# Patient Record
Sex: Female | Born: 1950 | Race: Black or African American | Hispanic: No | Marital: Married | Smoking: Former smoker
Health system: Southern US, Community
[De-identification: ages and names within clinical notes are randomized; demographics above are authoritative.]

## PROBLEM LIST (undated history)

## (undated) DIAGNOSIS — T8859XA Other complications of anesthesia, initial encounter: Secondary | ICD-10-CM

## (undated) DIAGNOSIS — I10 Essential (primary) hypertension: Secondary | ICD-10-CM

## (undated) DIAGNOSIS — R011 Cardiac murmur, unspecified: Secondary | ICD-10-CM

## (undated) DIAGNOSIS — J189 Pneumonia, unspecified organism: Secondary | ICD-10-CM

## (undated) DIAGNOSIS — Z9889 Other specified postprocedural states: Secondary | ICD-10-CM

## (undated) DIAGNOSIS — T4145XA Adverse effect of unspecified anesthetic, initial encounter: Secondary | ICD-10-CM

## (undated) DIAGNOSIS — M1990 Unspecified osteoarthritis, unspecified site: Secondary | ICD-10-CM

## (undated) DIAGNOSIS — R112 Nausea with vomiting, unspecified: Secondary | ICD-10-CM

## (undated) DIAGNOSIS — D649 Anemia, unspecified: Secondary | ICD-10-CM

## (undated) HISTORY — PX: JOINT REPLACEMENT: SHX530

## (undated) HISTORY — PX: PARTIAL HYSTERECTOMY: SHX80

## (undated) HISTORY — PX: TONSILLECTOMY: SUR1361

---

## 1999-10-05 ENCOUNTER — Emergency Department (HOSPITAL_COMMUNITY): Admission: EM | Admit: 1999-10-05 | Discharge: 1999-10-05 | Payer: Self-pay | Admitting: Emergency Medicine

## 1999-10-30 ENCOUNTER — Emergency Department (HOSPITAL_COMMUNITY): Admission: EM | Admit: 1999-10-30 | Discharge: 1999-10-30 | Payer: Self-pay | Admitting: Emergency Medicine

## 2000-03-25 ENCOUNTER — Emergency Department (HOSPITAL_COMMUNITY): Admission: EM | Admit: 2000-03-25 | Discharge: 2000-03-25 | Payer: Self-pay

## 2000-04-03 ENCOUNTER — Emergency Department (HOSPITAL_COMMUNITY): Admission: EM | Admit: 2000-04-03 | Discharge: 2000-04-03 | Payer: Self-pay | Admitting: Emergency Medicine

## 2000-04-06 ENCOUNTER — Emergency Department (HOSPITAL_COMMUNITY): Admission: EM | Admit: 2000-04-06 | Discharge: 2000-04-06 | Payer: Self-pay | Admitting: Emergency Medicine

## 2000-04-06 ENCOUNTER — Encounter: Payer: Self-pay | Admitting: Emergency Medicine

## 2000-04-06 ENCOUNTER — Ambulatory Visit (HOSPITAL_COMMUNITY): Admission: RE | Admit: 2000-04-06 | Discharge: 2000-04-06 | Payer: Self-pay | Admitting: Family Medicine

## 2004-12-10 ENCOUNTER — Emergency Department (HOSPITAL_COMMUNITY): Admission: EM | Admit: 2004-12-10 | Discharge: 2004-12-10 | Payer: Self-pay | Admitting: Emergency Medicine

## 2005-08-01 ENCOUNTER — Emergency Department (HOSPITAL_COMMUNITY): Admission: EM | Admit: 2005-08-01 | Discharge: 2005-08-01 | Payer: Self-pay | Admitting: Emergency Medicine

## 2005-08-26 ENCOUNTER — Encounter: Admission: RE | Admit: 2005-08-26 | Discharge: 2005-08-26 | Payer: Self-pay | Admitting: Family Medicine

## 2005-10-10 ENCOUNTER — Encounter: Admission: RE | Admit: 2005-10-10 | Discharge: 2005-10-10 | Payer: Self-pay | Admitting: Neurology

## 2005-12-26 ENCOUNTER — Ambulatory Visit: Payer: Self-pay | Admitting: Gastroenterology

## 2006-01-09 ENCOUNTER — Ambulatory Visit: Payer: Self-pay | Admitting: Gastroenterology

## 2006-09-28 ENCOUNTER — Encounter: Admission: RE | Admit: 2006-09-28 | Discharge: 2006-09-28 | Payer: Self-pay | Admitting: Family Medicine

## 2009-03-28 ENCOUNTER — Encounter: Admission: RE | Admit: 2009-03-28 | Discharge: 2009-03-28 | Payer: Self-pay | Admitting: Family Medicine

## 2010-09-14 ENCOUNTER — Encounter: Payer: Self-pay | Admitting: Family Medicine

## 2010-09-15 ENCOUNTER — Encounter: Payer: Self-pay | Admitting: Neurology

## 2010-09-15 ENCOUNTER — Encounter: Payer: Self-pay | Admitting: Family Medicine

## 2011-01-10 NOTE — Consult Note (Signed)
[facility]  Patient:    [name], [name]                     MRN: [id number] Proc. Date: 04/06/00 Adm. Date:  [id number] Disc. Date: [id number] Attending:  Shelba Flake CC:         Kendrick Ranch, M.D.  [facility], [address] [place] [place], [place]   Consultation Report  HISTORY OF PRESENT ILLNESS:  Peggy Harrell is a 60 year old right-handed black female, born Dec 19, 1950, with a history of onset of tinnitus involving mainly the left ear that started about two and a half weeks ago. Patient was found to have a wax plug in the left ear and this was irrigated and removed; patient, however, continued to have tinnitus that has actually worsened over time, typically severe yesterday and today.  Patient has what she believes is tinnitus only in the left ear, with decreased hearing in the left ear as well.  Patient has had some pressure sensation that also had developed across the head and patient denies any neck stiffness, however. Patient has some slight sensation of dizziness but not true vertigo, may have a slight change in her balance but not severe.  Today, patient had a brief episode of left face and left upper extremity paresthesias that have now completely cleared.  Patient has not had any slurring of speech, visual changes, double vision, loss of vision.  Does complain of a dry mouth. Patient has been seen by Dr. Kathy Breach of ear, nose and throat, and apparently has had audiometric testing, the results of which are not clear. Patients primary doctor is Dr. Kendrick Ranch.  Patient comes to the emergency room today and has had a CT scan of the brain that appears to show now acute changes.  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  Patient has had blood work that shows a fairly unremarkable comprehensive metabolic profile with the exception of a sodium of 3.1.  Neurology is asked to see  this patient for further evaluation.  PAST MEDICAL HISTORY 1. History of tinnitus and pressure sensation of the head, as above. 2. Obesity. 3. History of knee arthritis. 4. Borderline hypertension. 5. History of partial hysterectomy in 1994.  MEDICATIONS:  Patient is on some hydrochlorothiazide one a day.  She had been taking medications for arthritis.  ALLERGIES:  Patient has no known allergies.  SOCIAL HISTORY:  Does not smoke or drink.  Patient lives in the [place] area, is married and has two daughters who are alive and well.  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  FAMILY MEDICAL HISTORY:  Notable that the father died with a stroke and mother died with MI and stroke.  Patient has no brothers or sisters.  No family history of diabetes noted.  REVIEW OF SYSTEMS:  Notable for no fevers or chills.  Patient denies any loss of consciousness.  Has had some blurred vision at times.  Feels she has some difficulty in swallowing at times.  A dry mouth.  Denies neck stiffness, as above, and has had some slight shortness of breath at times.  Denies chest pain.  She has had some nausea today.  Denies any problems controlling the bowels or bladder.  PHYSICAL EXAMINATION  VITALS:  Blood pressure is 164/86, heart rate 85, respiratory rate 20, temperature afebrile.  GENERAL:  This patient is a markedly obese black female who is alert and cooperative at the time  of examination.  HEENT:  Head is atraumatic.  Eyes:  Pupils are equal, round and reactive to light.  Disks are flat bilaterally; no venous pulsations are seen, however. Tongue is midline.  Positive gag reflex is seen.  NECK:  Supple.  No carotid bruits.  RESPIRATORY:  Examination is clear to auscultation and percussion.  CARDIOVASCULAR:  Distant heart sounds.  No obvious murmurs or rubs noted.  EXTREMITIES:  Without significant edema.  NEUROLOGIC:  Cranial nerves as above.  Facial symmetry appears to be  present; patient occasionally with blink less rapidly on the right eye than the left, however.  Pinprick and soft touch sensation of the face are symmetric and normal.  Strength of facial muscles to direct testing is normal.  Speech is well-enunciated, not aphasic.  No dysarthria is seen.  Extraocular movements again are full.  No nystagmus is seen.  Tympanic membranes appear to be clear on the right, somewhat erythematous on the left.  Patient has good strength on all fours.  Good and symmetric motor tone is noted bilaterally.  Sensory testing reveals a very questionable slight decrease in pinprick sensation in the left arm and left leg as compared to the right.  Vibratory sensation again is slightly depressed on the left arm as compared to the right and left leg as compared to the right and deep tendon reflexes are symmetric and normal throughout.  Toes are downgoing bilaterally.  Patient has good finger-to-nose-to-finger and heel-to-shin bilaterally.  Rapid alternating movements in the upper extremities are symmetric and normal.  Gait is normal. Good tandem gait is seen.  Romberg negative.  No evidence of pronator drift is seen.  IMAGING STUDY:  CT scan of the head again shows no significant abnormalities of the cavum septum pellucidum are noted.  LABORATORY VALUES:  Laboratory values show sodium of 138, potassium of 3.2, chloride of 99, CO2 of 31, glucose of 75, BUN of 11, creatinine 0.8, calcium 9.0, total protein 6.6, albumin of 3.4, AST of 17, ALT of 14, alkaline phosphatase 88, total bilirubin of 0.7.  IMPRESSION  New onset of pressure sensation of the head and tinnitus, left ear, etiology unclear.  This patient has had a gradual onset of the tinnitus but over the last two days, the patient has experienced a pressure sensation in the head and had a brief episode of left face and arm numbness that has cleared.  Examination at this point is completely unremarkable with the  exception of some residual  subjective sensory changes.  Patient will require further evaluation to exclude small vessel ischemic disease.  Will pursue this workup as an outpatient.  PLAN 1. MRI scan of the brain. 2. MRI angiogram of intracranial and carotid vessels. 3. Baby aspirin 81 mg one a day. 4. If above studies are unremarkable, may consider brain stem auditory-evoked    response testing and possibly a lumbar puncture to rule out pseudotumor    cerebri as a possible etiology of the above problems. 5. Patient will follow up at [facility] in two to three    weeks following discharge. DD:  04/06/00 TD:  04/07/00 Job: [id number]

## 2011-09-08 ENCOUNTER — Emergency Department (HOSPITAL_COMMUNITY)
Admission: EM | Admit: 2011-09-08 | Discharge: 2011-09-08 | Disposition: A | Discharge status: Home / Self Care | Payer: Self-pay | Attending: Emergency Medicine | Admitting: Emergency Medicine

## 2011-09-08 ENCOUNTER — Emergency Department (HOSPITAL_COMMUNITY): Payer: Self-pay

## 2011-09-08 ENCOUNTER — Encounter (HOSPITAL_COMMUNITY): Payer: Self-pay | Admitting: *Deleted

## 2011-09-08 DIAGNOSIS — E669 Obesity, unspecified: Secondary | ICD-10-CM | POA: Insufficient documentation

## 2011-09-08 DIAGNOSIS — R42 Dizziness and giddiness: Secondary | ICD-10-CM | POA: Insufficient documentation

## 2011-09-08 DIAGNOSIS — R112 Nausea with vomiting, unspecified: Secondary | ICD-10-CM | POA: Insufficient documentation

## 2011-09-08 DIAGNOSIS — R51 Headache: Secondary | ICD-10-CM | POA: Insufficient documentation

## 2011-09-08 HISTORY — DX: Essential (primary) hypertension: I10

## 2011-09-08 MED ORDER — ONDANSETRON HCL 4 MG/2ML IJ SOLN
4.0000 mg | Freq: Once | INTRAMUSCULAR | Status: AC
  Administered 2011-09-08: 4 mg via INTRAVENOUS
  Filled 2011-09-08: qty 2

## 2011-09-08 MED ORDER — ONDANSETRON HCL 4 MG PO TABS: 4.0000 mg | ORAL_TABLET | Freq: Four times a day (QID) | ORAL | Status: AC

## 2011-09-08 MED ORDER — IOHEXOL 350 MG/ML SOLN
100.0000 mL | Freq: Once | INTRAVENOUS | Status: AC | PRN
  Administered 2011-09-08: 100 mL via INTRAVENOUS

## 2011-09-08 MED ORDER — SODIUM CHLORIDE 0.9 % IV BOLUS (SEPSIS)
1000.0000 mL | Freq: Once | INTRAVENOUS | Status: AC
  Administered 2011-09-08: 1000 mL via INTRAVENOUS

## 2011-09-08 MED ORDER — DIAZEPAM 5 MG/ML IJ SOLN
5.0000 mg | Freq: Once | INTRAMUSCULAR | Status: AC
  Administered 2011-09-08: 5 mg via INTRAVENOUS
  Filled 2011-09-08: qty 2

## 2011-09-08 NOTE — ED Notes (Signed)
WUJ:[id number]<JY> Expected date:<BR> Expected time:<BR> Means of arrival:<BR> Comments:<BR> Hold for triage 1

## 2011-09-08 NOTE — ED Notes (Signed)
Pt in c/o sudden onset of a headache, states she was in bed and turned her head and felt like something popped in the back of her neck, instantly developed a headache and vomited. Pt c/o dizziness since that time. Denies a history of headaches.

## 2011-09-08 NOTE — ED Notes (Signed)
Pt reports she twisted her neck and heard a pop earlier today and since then has had severe dizziness, headache, N/V.

## 2011-09-16 NOTE — ED Provider Notes (Signed)
History    [age]-year-old female with headache. Sudden onset while she was turning her head while laying in bed. Persistent dense. Headache is in the posterior region. No radiation. Associated with dizziness. Does not describe true vertigo. Describes more of a sensation of lightheadedness that she may pass out. Feels very nauseated. Vomiting times one. No history of similar headaches. No fever or chills. Felt fine earlier in the day. No numbness, weakness, loss of strength, or acute visual changes. Neck does not feel stiff.  CSN: [id number]  Arrival date & time 09/08/11  1814   First MD Initiated Contact with Patient 09/08/11 1841      Chief Complaint  Patient presents with  . Headache  . Emesis    (Consider location/radiation/quality/duration/timing/severity/associated sxs/prior treatment) HPI  Past Medical History  Diagnosis Date  . Hypertension     History reviewed. No pertinent past surgical history.  History reviewed. No pertinent family history.  History  Substance Use Topics  . Smoking status: Never Smoker   . Smokeless tobacco: Not on file  . Alcohol Use: No    OB History    Grav Para Term Preterm Abortions TAB SAB Ect Mult Living                  Review of Systems   Review of symptoms negative unless otherwise noted in HPI.   Allergies  Sulfa antibiotics  Home Medications   Current Outpatient Rx  Name Route Sig Dispense Refill  . VITAMIN D 1000 UNITS PO TABS Oral Take 1,000 Units by mouth daily.    Marland Kitchen HYDROCHLOROTHIAZIDE 25 MG PO TABS Oral Take 25 mg by mouth daily.    Marland Kitchen VITAMIN B-12 1000 MCG PO TABS Oral Take 1,000 mcg by mouth daily.    Marland Kitchen ONDANSETRON HCL 4 MG PO TABS Oral Take 1 tablet (4 mg total) by mouth every 6 (six) hours. 12 tablet 0    BP 135/65  Pulse 74  Temp(Src) 98.7 F (37.1 C) (Oral)  Resp 16  SpO2 100%  Physical Exam  Nursing note and vitals reviewed. Constitutional: She is oriented to person, place, and time. No distress.     obese  HENT:  Head: Normocephalic and atraumatic.  Right Ear: External ear normal.  Left Ear: External ear normal.  Eyes: Conjunctivae are normal. Pupils are equal, round, and reactive to light. Right eye exhibits no discharge. Left eye exhibits no discharge.  Neck: Normal range of motion. Neck supple.  Cardiovascular: Normal rate, regular rhythm and normal heart sounds.  Exam reveals no gallop and no friction rub.   No murmur heard. Pulmonary/Chest: Effort normal and breath sounds normal. No respiratory distress.  Abdominal: Soft. She exhibits no distension. There is no tenderness.  Musculoskeletal: She exhibits no edema and no tenderness.  Lymphadenopathy:    She has no cervical adenopathy.  Neurological: She is alert and oriented to person, place, and time. No cranial nerve deficit. She exhibits normal muscle tone. Coordination normal.       Good finger to nose and heel to shin testing bilaterally.  Skin: Skin is warm and dry. She is not diaphoretic.  Psychiatric: She has a normal mood and affect. Her behavior is normal. Thought content normal.    ED Course  Procedures (including critical care time)  Labs Reviewed - No data to display No results found.  Ct Angio Head W/cm &/or Wo Cm  09/08/2011  *RADIOLOGY REPORT*  Clinical Data:  Turned  neck and felt pop in  the occipital region. Severe vertigo and headaches since.  Possible vertebral artery dissection nor subarachnoid hemorrhage.  CT ANGIOGRAPHY HEAD AND NECK  Technique:  Multidetector CT imaging of the head and neck was performed using the standard protocol during bolus administration of intravenous contrast.  Multiplanar CT image reconstructions including MIPs were obtained to evaluate the vascular anatomy. Carotid stenosis measurements (when applicable) are obtained utilizing NASCET criteria, using the distal internal carotid diameter as the denominator.  Contrast: OMNIPAQUE IOHEXOL 350 MG/ML IV SOLN  Comparison:  MRI  08/26/2005.  Head CT 08/01/2005  CTA NECK  Findings:  Lung apices are clear.  No superior mediastinal pathology.  Branching pattern of the brachiocephalic vessels from the arch is normal.  No origin stenoses.  The right common carotid artery is widely patent to the bifurcation.  No bifurcation disease.  External and internal cervical carotid arteries appear normal.  The left common carotid arteries widely patent to the bifurcation. No bifurcation disease.  Internal and external carotid arteries are normal through the cervical region.  Both vertebral artery origins are widely patent.  The left vertebral artery is slightly larger than the right.  Both vertebral arteries appear normal through the neck and through the foramen magnum.  No soft tissue lesion of the neck is identified.  No significant bony finding.   Review of the MIP images confirms the above findings.  IMPRESSION: Negative CT angiography of the neck vessels.  Specifically, no evidence of vertebral artery dissection or other pathology.  CTA HEAD  Findings:  Both internal carotid arteries are widely patent through the siphon regions.  Both anterior and middle cerebral vessels are patent without stenosis, aneurysm or vascular malformation.  Both vertebral arteries are widely patent to the basilar.  No basilar stenosis.  Posterior circulation branch vessels are patent.  There is a large tentorial vein on the right, an anatomic variant but not of clinical significance.  The brain has a normal appearance without evidence of old or acute infarction, mass lesion, hemorrhage, hydrocephalus or extra-axial collection.  Incidental cavum septum pellucidum.  No calvarial abnormality.  Sinuses, middle ears and mastoids are clear.   Review of the MIP images confirms the above findings.  IMPRESSION: Normal intracranial CT angiography.  No evidence of vascular dissection.  No evidence of intracranial hemorrhage.  Normal appearing brain.  Original Report Authenticated By:  Thomasenia Sales, M.D.   Ct Angio Neck W/cm &/or Wo/cm  09/08/2011  *RADIOLOGY REPORT*  Clinical Data:  Turned  neck and felt pop in the occipital region. Severe vertigo and headaches since.  Possible vertebral artery dissection nor subarachnoid hemorrhage.  CT ANGIOGRAPHY HEAD AND NECK  Technique:  Multidetector CT imaging of the head and neck was performed using the standard protocol during bolus administration of intravenous contrast.  Multiplanar CT image reconstructions including MIPs were obtained to evaluate the vascular anatomy. Carotid stenosis measurements (when applicable) are obtained utilizing NASCET criteria, using the distal internal carotid diameter as the denominator.  Contrast: OMNIPAQUE IOHEXOL 350 MG/ML IV SOLN  Comparison:  MRI 08/26/2005.  Head CT 08/01/2005  CTA NECK  Findings:  Lung apices are clear.  No superior mediastinal pathology.  Branching pattern of the brachiocephalic vessels from the arch is normal.  No origin stenoses.  The right common carotid artery is widely patent to the bifurcation.  No bifurcation disease.  External and internal cervical carotid arteries appear normal.  The left common carotid arteries widely patent to the bifurcation. No bifurcation disease.  Internal and external carotid arteries are normal through the cervical region.  Both vertebral artery origins are widely patent.  The left vertebral artery is slightly larger than the right.  Both vertebral arteries appear normal through the neck and through the foramen magnum.  No soft tissue lesion of the neck is identified.  No significant bony finding.   Review of the MIP images confirms the above findings.  IMPRESSION: Negative CT angiography of the neck vessels.  Specifically, no evidence of vertebral artery dissection or other pathology.  CTA HEAD  Findings:  Both internal carotid arteries are widely patent through the siphon regions.  Both anterior and middle cerebral vessels are patent without stenosis,  aneurysm or vascular malformation.  Both vertebral arteries are widely patent to the basilar.  No basilar stenosis.  Posterior circulation branch vessels are patent.  There is a large tentorial vein on the right, an anatomic variant but not of clinical significance.  The brain has a normal appearance without evidence of old or acute infarction, mass lesion, hemorrhage, hydrocephalus or extra-axial collection.  Incidental cavum septum pellucidum.  No calvarial abnormality.  Sinuses, middle ears and mastoids are clear.   Review of the MIP images confirms the above findings.  IMPRESSION: Normal intracranial CT angiography.  No evidence of vascular dissection.  No evidence of intracranial hemorrhage.  Normal appearing brain.  Original Report Authenticated By: Thomasenia Sales, M.D.   1. Headache   2. Dizziness and giddiness       MDM  [age]-year-old female with headache. Sudden onset while turning her head in bed. Concern for possible vertebral or carotid artery dissection as etiology. Subsequent CTA of the head/neck did not show any evidence of this there is no evidence of any intracranial bleeding. Prior to discharge patient reports feeling much better. Her  dizziness has completely resolved. She has a nonfocal neurological examination. Strict return precautions discussed. Followup as an outpatient.        Raeford Razor, MD 09/16/11 [id number]

## 2012-05-05 ENCOUNTER — Other Ambulatory Visit: Payer: Self-pay | Admitting: Family Medicine

## 2012-05-05 DIAGNOSIS — Z780 Asymptomatic menopausal state: Secondary | ICD-10-CM

## 2012-05-26 ENCOUNTER — Inpatient Hospital Stay: Admission: RE | Admit: 2012-05-26 | Payer: Self-pay | Source: Ambulatory Visit

## 2012-05-26 ENCOUNTER — Ambulatory Visit: Payer: Self-pay

## 2012-06-23 ENCOUNTER — Other Ambulatory Visit: Payer: Self-pay

## 2012-06-23 ENCOUNTER — Ambulatory Visit: Payer: Self-pay

## 2012-08-04 ENCOUNTER — Other Ambulatory Visit: Payer: Self-pay

## 2012-08-04 ENCOUNTER — Ambulatory Visit: Payer: Self-pay

## 2012-09-15 ENCOUNTER — Ambulatory Visit
Admission: RE | Admit: 2012-09-15 | Discharge: 2012-09-15 | Disposition: A | Discharge status: Home / Self Care | Payer: 59 | Source: Ambulatory Visit | Attending: Family Medicine | Admitting: Family Medicine

## 2012-09-15 DIAGNOSIS — Z780 Asymptomatic menopausal state: Secondary | ICD-10-CM

## 2012-09-20 ENCOUNTER — Other Ambulatory Visit: Payer: Self-pay | Admitting: Family Medicine

## 2012-09-20 DIAGNOSIS — R928 Other abnormal and inconclusive findings on diagnostic imaging of breast: Secondary | ICD-10-CM

## 2012-09-28 ENCOUNTER — Other Ambulatory Visit: Payer: Self-pay | Admitting: Family Medicine

## 2012-09-28 DIAGNOSIS — R928 Other abnormal and inconclusive findings on diagnostic imaging of breast: Secondary | ICD-10-CM

## 2012-10-08 ENCOUNTER — Other Ambulatory Visit: Payer: 59

## 2012-10-13 ENCOUNTER — Ambulatory Visit
Admission: RE | Admit: 2012-10-13 | Discharge: 2012-10-13 | Disposition: A | Discharge status: Home / Self Care | Payer: 59 | Source: Ambulatory Visit | Attending: Family Medicine | Admitting: Family Medicine

## 2012-10-13 DIAGNOSIS — R928 Other abnormal and inconclusive findings on diagnostic imaging of breast: Secondary | ICD-10-CM

## 2013-10-22 ENCOUNTER — Encounter (HOSPITAL_COMMUNITY): Payer: Self-pay | Admitting: Emergency Medicine

## 2013-10-22 ENCOUNTER — Emergency Department (HOSPITAL_COMMUNITY)
Admission: EM | Admit: 2013-10-22 | Discharge: 2013-10-22 | Disposition: A | Discharge status: Home / Self Care | Payer: 59 | Attending: Emergency Medicine | Admitting: Emergency Medicine

## 2013-10-22 DIAGNOSIS — M79652 Pain in left thigh: Secondary | ICD-10-CM

## 2013-10-22 DIAGNOSIS — G5710 Meralgia paresthetica, unspecified lower limb: Secondary | ICD-10-CM | POA: Insufficient documentation

## 2013-10-22 DIAGNOSIS — R209 Unspecified disturbances of skin sensation: Secondary | ICD-10-CM | POA: Insufficient documentation

## 2013-10-22 DIAGNOSIS — M79609 Pain in unspecified limb: Secondary | ICD-10-CM

## 2013-10-22 DIAGNOSIS — Z79899 Other long term (current) drug therapy: Secondary | ICD-10-CM | POA: Insufficient documentation

## 2013-10-22 DIAGNOSIS — G5712 Meralgia paresthetica, left lower limb: Secondary | ICD-10-CM

## 2013-10-22 DIAGNOSIS — M545 Low back pain, unspecified: Secondary | ICD-10-CM | POA: Insufficient documentation

## 2013-10-22 DIAGNOSIS — I10 Essential (primary) hypertension: Secondary | ICD-10-CM | POA: Insufficient documentation

## 2013-10-22 DIAGNOSIS — E669 Obesity, unspecified: Secondary | ICD-10-CM | POA: Insufficient documentation

## 2013-10-22 MED ORDER — HYDROCODONE-ACETAMINOPHEN 5-325 MG PO TABS
1.0000 | ORAL_TABLET | Freq: Once | ORAL | Status: AC
  Administered 2013-10-22: 1 via ORAL
  Filled 2013-10-22: qty 1

## 2013-10-22 MED ORDER — HYDROCODONE-ACETAMINOPHEN 5-325 MG PO TABS: 1.0000 | ORAL_TABLET | Freq: Four times a day (QID) | ORAL | Status: DC | PRN

## 2013-10-22 NOTE — ED Notes (Signed)
MD at bedside. 

## 2013-10-22 NOTE — Progress Notes (Signed)
*  Preliminary Results* Left lower extremity venous duplex completed. Left lower extremity is negative for deep vein thrombosis. There is no evidence of left Baker's cyst.  10/22/2013 12:07 PM  Gertie FeyMichelle Levis Nazir, RVT, RDCS, RDMS

## 2013-10-22 NOTE — ED Provider Notes (Signed)
CSN: [id number]     Arrival date & time 10/22/13  4098 History   First MD Initiated Contact with Patient 10/22/13 1021     Chief Complaint  Patient presents with  . Leg Pain     (Consider location/radiation/quality/duration/timing/severity/associated sxs/prior Treatment) HPI  63 year old female with history of hypertension presents complaining of left thigh pain and leg weakness. Patient reports for the past 2-3 days she has had a throbbing pain to the top of her left thigh, persistent, worsening with certain position.  Pain occasionally radiates to her low back. Endorse mild low back pain, worsening with leg movement. She mentioned persistent tingling and numbness sensation to the surface of the left thigh, nothing makes it better or worse.  She also reports generalized lower leg weakness for the past several months. States her doctor is aware. She reports heart palpitation for about a minute 2 days ago has resolved. She also endorsed a mild nausea this morning has also resolved. She denies any prior history of DVT but does admits to long trip to [place] and back about a week ago. Not taking any birth control pill, no recent surgery, no unilateral leg swelling, no history of cancer, no history of IV drug use. Has been taking Aleve with minimal relief. Patient denies wearing tight pants, or wearing wallet on the affected side.  Past Medical History  Diagnosis Date  . Hypertension    History reviewed. No pertinent past surgical history. History reviewed. No pertinent family history. History  Substance Use Topics  . Smoking status: Never Smoker   . Smokeless tobacco: Not on file  . Alcohol Use: No   OB History   Grav Para Term Preterm Abortions TAB SAB Ect Mult Living                 Review of Systems  All other systems reviewed and are negative.      Allergies  Lisinopril and Sulfa antibiotics  Home Medications   Current Outpatient Rx  Name  Route  Sig  Dispense  Refill   . cholecalciferol (VITAMIN D) 1000 UNITS tablet   Oral   Take 1,000 Units by mouth daily.         . hydrochlorothiazide (HYDRODIURIL) 25 MG tablet   Oral   Take 25 mg by mouth daily.          BP 158/72  Pulse 90  Temp(Src) 97.9 F (36.6 C) (Oral)  Resp 16  Ht 5\' 10"  (1.778 m)  Wt 274 lb 8 oz (124.512 kg)  BMI 39.39 kg/m2  SpO2 97% Physical Exam  Nursing note and vitals reviewed. Constitutional: She appears well-developed and well-nourished. No distress.  Patient is moderately obese  HENT:  Head: Atraumatic.  Eyes: Conjunctivae are normal.  Neck: Neck supple.  Cardiovascular: Intact distal pulses.   Musculoskeletal: She exhibits tenderness (Left thigh with generalized tenderness on palpation without focal point tenderness. Mild decreased sensation to the anterior aspects of left thigh with light touch. No swelling noted.). She exhibits no edema.  No significant midline spine tenderness, crepitus, step off noted, no overlying skin changes.  Negative straight leg raise. Bilateral hip with full range of motion.  Unable to elicit patellar deep tendon reflex bilaterally but no foot drop. 5/5 strength to all 4 extremities with intact distal pulses.  Normal skin color to BLE.      Neurological: She is alert.  Skin: No rash noted.  Psychiatric: She has a normal mood and affect.  ED Course  Procedures (including critical care time)  11:03 AM Patient complaining of subjective paresthesia to anterior thigh on the left leg. Symptoms suggestive of meralgia paresthesia especially considering that patient is obese. However since patient had recent long trip in complaining of thigh pain will obtain vascular Doppler to rule out DVT. No signs to suggest infection. She is otherwise neurovascularly intact and no focal weakness. Pain medication given.  12:06 PM The venous Doppler ultrasound of left lower extremities without evidence of DVT. Since patient is neurovascularly intact,  normal strength, no evidence of infection, and no red flags recommend patient to followup with PCP for further management.  Care discussed with attending.     Duard BradyHopkins, [place] J Female 1951/05/15 [id number]            Progress Notes by Lawrence MarseillesMichelle A Simonetti at 10/22/2013 12:07 PM    Author: Lawrence MarseillesMichelle A Simonetti Service: Vascular Lab Author Type: Cardiovascular Sonographer   Filed: 10/22/2013 12:07 PM Note Time: 10/22/2013 12:07 PM Status: Signed   Editor: Lawrence MarseillesMichelle A Simonetti (Cardiovascular Sonographer)      *Preliminary Results*  Left lower extremity venous duplex completed.  Left lower extremity is negative for deep vein thrombosis. There is no evidence of left Baker's cyst.  10/22/2013 12:07 PM  Gertie FeyMichelle Simonetti, RVT, RDCS, RDMS         Labs Review Labs Reviewed - No data to display Imaging Review No results found.   EKG Interpretation None      MDM   Final diagnoses:  Musculoskeletal pain of left thigh  Meralgia paresthetica of left side    BP 150/84  Pulse 65  Temp(Src) 97.9 F (36.6 C) (Oral)  Resp 16  Ht 5\' 10"  (1.778 m)  Wt 274 lb 8 oz (124.512 kg)  BMI 39.39 kg/m2  SpO2 100%  I have reviewed nursing notes and vital signs. I personally reviewed the imaging tests through PACS system  I reviewed available ER/hospitalization records thought the EMR     Fayrene HelperBowie Amiere Cawley, [place] JerseyPA-C 10/22/13 1221

## 2013-10-22 NOTE — Discharge Instructions (Signed)
Please follow up with your doctor for further care.  Take pain medication as needed.  Return if you developed skin changes, signs of infection, or if you have other concerns.   Pinched Nerve The term pinched nerve describes one type of damage or injury to a nerve or set of nerves. Pinched nerves can sometimes lead to other conditions. These include peripheral neuropathy, carpal tunnel syndrome, and tennis elbow. The extent of such injuries may vary from minor, temporary damage to a more permanent condition. Early diagnosis is important to prevent further damage or complications. Pinched nerve is a common cause of on-the-job injury. CAUSES  The injury may result from:  Compression.  Constriction.  Stretching. SYMPTOMS  Symptoms include:  Numbness.  "Pins and needles" or burning sensations.  Pain radiating outward from the injured area.  One of the most common examples of a single compressed nerve is the feeling of having a foot or hand "fall asleep." TREATMENT  The most often recommended treatment for pinched nerve is rest for the affected area. Corticosteroids help alleviate pain. In some cases, surgery is recommended. Physical therapy may be recommended. Splints or collars may be used. With treatment, most people recover from pinched nerve. In some cases, the damage is irreversible. Document Released: 08/01/2002 Document Revised: 11/03/2011 Document Reviewed: 07/19/2008 [facility] Patient Information 2014 [place]ExitCare, [place].

## 2013-10-22 NOTE — ED Notes (Signed)
US Technician stated, "Patient is negative for DVT."

## 2013-10-22 NOTE — ED Notes (Signed)
C/o intermittent numbness and pain to L thigh for past weeks, yesterday it was more severe and her lower back started to hurt. Today she woke with nausea. She took aleve yesterday with some relief of pain

## 2013-10-22 NOTE — ED Provider Notes (Signed)
Medical screening examination/treatment/procedure(s) were performed by non-physician practitioner and as supervising physician I was immediately available for consultation/collaboration.    Ebonye Reade L Artie Takayama, MD 10/22/13 1238 

## 2013-10-22 NOTE — ED Notes (Signed)
US Technician at the bedside.

## 2013-11-24 ENCOUNTER — Other Ambulatory Visit: Payer: Self-pay

## 2013-11-24 DIAGNOSIS — Z1231 Encounter for screening mammogram for malignant neoplasm of breast: Secondary | ICD-10-CM

## 2013-12-14 ENCOUNTER — Ambulatory Visit: Payer: 59

## 2013-12-14 ENCOUNTER — Ambulatory Visit
Admission: RE | Admit: 2013-12-14 | Discharge: 2013-12-14 | Disposition: A | Discharge status: Home / Self Care | Payer: 59 | Source: Ambulatory Visit

## 2013-12-14 ENCOUNTER — Encounter (INDEPENDENT_AMBULATORY_CARE_PROVIDER_SITE_OTHER): Payer: Self-pay

## 2013-12-14 DIAGNOSIS — Z1231 Encounter for screening mammogram for malignant neoplasm of breast: Secondary | ICD-10-CM

## 2014-09-22 ENCOUNTER — Other Ambulatory Visit: Payer: Self-pay | Admitting: Family Medicine

## 2014-09-22 DIAGNOSIS — Z1231 Encounter for screening mammogram for malignant neoplasm of breast: Secondary | ICD-10-CM

## 2014-12-18 ENCOUNTER — Ambulatory Visit: Payer: Self-pay

## 2015-01-25 ENCOUNTER — Ambulatory Visit
Admission: RE | Admit: 2015-01-25 | Discharge: 2015-01-25 | Disposition: A | Discharge status: Home / Self Care | Payer: 59 | Source: Ambulatory Visit | Attending: Family Medicine | Admitting: Family Medicine

## 2015-01-25 ENCOUNTER — Ambulatory Visit: Payer: Self-pay

## 2015-01-25 DIAGNOSIS — Z1231 Encounter for screening mammogram for malignant neoplasm of breast: Secondary | ICD-10-CM

## 2015-01-28 ENCOUNTER — Encounter (HOSPITAL_COMMUNITY): Payer: Self-pay | Admitting: *Deleted

## 2015-01-28 ENCOUNTER — Emergency Department (HOSPITAL_COMMUNITY)
Admission: EM | Admit: 2015-01-28 | Discharge: 2015-01-28 | Disposition: A | Discharge status: Home / Self Care | Payer: 59 | Attending: Emergency Medicine | Admitting: Emergency Medicine

## 2015-01-28 DIAGNOSIS — Z791 Long term (current) use of non-steroidal anti-inflammatories (NSAID): Secondary | ICD-10-CM | POA: Insufficient documentation

## 2015-01-28 DIAGNOSIS — Z79899 Other long term (current) drug therapy: Secondary | ICD-10-CM | POA: Insufficient documentation

## 2015-01-28 DIAGNOSIS — H43392 Other vitreous opacities, left eye: Secondary | ICD-10-CM

## 2015-01-28 DIAGNOSIS — I10 Essential (primary) hypertension: Secondary | ICD-10-CM | POA: Insufficient documentation

## 2015-01-28 NOTE — ED Notes (Signed)
Awake. Verbally responsive. A/O x4. Resp even and unlabored. No audible adventitious breath sounds noted. ABC's intact.  

## 2015-01-28 NOTE — ED Provider Notes (Signed)
CSN: [id number]     Arrival date & time 01/28/15  [id number] History   First MD Initiated Contact with Patient 01/28/15 [phone]     Chief Complaint  Patient presents with  . Eye Problem    HPI Patient presents to the emergency room with complaints of intermittent visual field disturbances of her left eye. Patient states over the last week she has noticed an intermittent object floating across her field of vision in her left eye. Sometimes it looks like something is dripping down. Sometimes seems like there is stranding. It has been coming and going for the past week. She talked to the nurse hotline for her insurance and was told to come to the emergency room. Patient wanted to wait overnight to see if the symptoms would resolve but she still notices it somewhat today. She denies any trouble with blurred vision. She does not have any pain. She denies any injuries. She has not noticed any visual field cuts. Past Medical History  Diagnosis Date  . Hypertension    History reviewed. No pertinent past surgical history. No family history on file. History  Substance Use Topics  . Smoking status: Never Smoker   . Smokeless tobacco: Not on file  . Alcohol Use: No   OB History    No data available     Review of Systems  All other systems reviewed and are negative.     Allergies  Lisinopril and Sulfa antibiotics  Home Medications   Prior to Admission medications   Medication Sig Start Date End Date Taking? Authorizing Provider  cholecalciferol (VITAMIN D) 1000 UNITS tablet Take 3,000 Units by mouth daily.     Historical Provider, MD  hydrochlorothiazide (HYDRODIURIL) 25 MG tablet Take 25 mg by mouth daily.    Historical Provider, MD  HYDROcodone-acetaminophen (NORCO/VICODIN) 5-325 MG per tablet Take 1 tablet by mouth every 6 (six) hours as needed for severe pain. 10/22/13   Fayrene HelperBowie Tran, PA-C  Multiple Vitamins-Minerals (HAIR/SKIN/NAILS PO) Take 3 capsules by mouth daily.    Historical Provider, MD   naproxen sodium (ANAPROX) 220 MG tablet Take 220 mg by mouth daily as needed (arthritis/pain).    Historical Provider, MD   BP 140/69 mmHg  Pulse 91  Temp(Src) 98.3 F (36.8 C) (Oral)  Resp 18  SpO2 97% Physical Exam  Constitutional: She appears well-developed and well-nourished. No distress.  HENT:  Head: Normocephalic and atraumatic.  Right Ear: External ear normal.  Left Ear: External ear normal.  Eyes: Conjunctivae and EOM are normal. Right eye exhibits no discharge. Left eye exhibits no discharge. No scleral icterus. Right eye exhibits normal extraocular motion. Left eye exhibits normal extraocular motion. Right pupil is round. Left pupil is round. Pupils are equal.  Fundoscopic exam:      The left eye shows no exudate, no hemorrhage and no papilledema.  Neck: Neck supple. No tracheal deviation present.  Cardiovascular: Normal rate.   Pulmonary/Chest: Effort normal. No stridor. No respiratory distress.  Musculoskeletal: She exhibits no edema.  Neurological: She is alert. Cranial nerve deficit: no gross deficits.  Patient has no deficits in her visual fields  Skin: Skin is warm and dry. No rash noted.  Psychiatric: She has a normal mood and affect.  Nursing note and vitals reviewed.   ED Course  Procedures (including critical care time) Labs Review Labs Reviewed - No data to display  Imaging Review No results found.   EKG Interpretation None      MDM   Final  diagnoses:  Floaters in visual field, left    The patient's pupils were not dilated for the exam. There was some limitations because of the pupil size. However, I do not see any evidence of hemorrhage or retinal detachment. Symptoms are more suggestive of vitreous floaters. I will have the patient follow up with an ophthalmologist. Warning signs and precautions were discussed    Linwood Dibbles, MD 01/28/15 1007

## 2015-01-28 NOTE — Discharge Instructions (Signed)
Eye Floaters A jelly-like fluid fills the inside of the eye and is called the vitreous. The vitreous is normally clear. It allows light to pass through to the back of the eye to the tissues that contain the nerves needed for vision (the retina). With age, the vitreous can start to decline. If a decline happens, specks of material from clumps of cells, blood, or other materials may start to float around inside the eye. These objects cast shadows on the retina. These shadows are seen as moving strings, streaks, "bugs," dust or spider webs floating in front of the eye. CAUSES   Age.  A high degree of near-sightedness (high myopia).  Tears in the retina.  Bleeding inside the eye from broken retinal blood vessels as a result of disease (diabetes, inflammation of the retinal blood vessels, and others).  Blood clot of the major vein of the retina or its branches (retinal vein occlusion).  Trauma.  Retinal detachment.  Vitreous detachment.  Eye surgery.  Inflammation inside the eye (uveitis).  Infection inside the eye. SYMPTOMS   Seeing floating specs, dots or spider webs in the vision of one eye. This can sometimes be associated with flashes of light seen off to the side.  Bleeding in the eye may begin as floaters and lead to complete vision loss as the vitreous fills with blood. This may happen repeatedly in certain diseases of the blood vessels of the retina (e.g. diabetes).  If the vitreous shrinks enough to pull away from the retina (posterior vitreous detachment), a small circular ring-shaped floater may be seen. Migraine headaches may be associated with many forms of visual symptoms (sparkling dots, wavy lines) just before the headache strikes. These symptoms due to migraine are not from floaters. They will disappear when the headache goes away. DIAGNOSIS  An eye professional can tell you if you have floaters during an eye exam. TREATMENT  There is no treatment for the floaters  themselves.  If the floaters are due to a tear in the retina, a retinal detachment or other eye disease, the condition that caused the floaters must be treated.  Floaters due to blood in the eye often go away or lessen with time. SEEK MEDICAL CARE IF:   You suddenly see floating dots or spider webs in front of the vision of one or both eyes. This is especially true if you also see flashes of light off to the side (like flashes of lightening).  You see floaters and also notice a change or drop in your vision in either eye. Document Released: 08/14/2003 Document Revised: 11/03/2011 Document Reviewed: 12/09/2007 ExitCare Patient Information 2015 [employer]. This information is not intended to replace advice given to you by your health care provider. Make sure you discuss any questions you have with your health care provider.  

## 2015-01-28 NOTE — ED Notes (Signed)
Pt reports left eye vision changes x 1 week, sts it got worse and she called her eye doctor and was told to come to ED for eval.

## 2015-01-28 NOTE — ED Notes (Signed)
Pt reported feeling like string is falling across eye since Friday and intermittent blurred vision. Pt denies eye pain. No drainage/discharge or redness to sclera noted.

## 2015-03-08 ENCOUNTER — Ambulatory Visit: Payer: 59 | Admitting: Family Medicine

## 2015-03-23 ENCOUNTER — Ambulatory Visit: Payer: 59

## 2015-12-13 ENCOUNTER — Encounter: Payer: Self-pay | Admitting: Gastroenterology

## 2016-02-09 ENCOUNTER — Other Ambulatory Visit: Payer: Self-pay | Admitting: Family Medicine

## 2016-02-09 DIAGNOSIS — E2839 Other primary ovarian failure: Secondary | ICD-10-CM

## 2016-02-09 DIAGNOSIS — Z1231 Encounter for screening mammogram for malignant neoplasm of breast: Secondary | ICD-10-CM

## 2016-02-24 ENCOUNTER — Emergency Department (HOSPITAL_COMMUNITY)
Admission: EM | Admit: 2016-02-24 | Discharge: 2016-02-24 | Disposition: A | Discharge status: Home / Self Care | Payer: Commercial Managed Care - PPO | Attending: Emergency Medicine | Admitting: Emergency Medicine

## 2016-02-24 ENCOUNTER — Encounter (HOSPITAL_COMMUNITY): Payer: Self-pay | Admitting: Emergency Medicine

## 2016-02-24 ENCOUNTER — Emergency Department (HOSPITAL_COMMUNITY): Payer: Commercial Managed Care - PPO

## 2016-02-24 DIAGNOSIS — Z79899 Other long term (current) drug therapy: Secondary | ICD-10-CM | POA: Insufficient documentation

## 2016-02-24 DIAGNOSIS — K5901 Slow transit constipation: Secondary | ICD-10-CM | POA: Insufficient documentation

## 2016-02-24 DIAGNOSIS — I10 Essential (primary) hypertension: Secondary | ICD-10-CM | POA: Insufficient documentation

## 2016-02-24 DIAGNOSIS — M1990 Unspecified osteoarthritis, unspecified site: Secondary | ICD-10-CM | POA: Insufficient documentation

## 2016-02-24 DIAGNOSIS — K5900 Constipation, unspecified: Secondary | ICD-10-CM | POA: Diagnosis present

## 2016-02-24 HISTORY — DX: Unspecified osteoarthritis, unspecified site: M19.90

## 2016-02-24 MED ORDER — LIDOCAINE HCL 2 % EX GEL: 1.0000 "application " | Freq: Once | CUTANEOUS | Status: DC

## 2016-02-24 NOTE — ED Notes (Signed)
Pt states she has been constipated since Monday  Pt states she has used OTC medications without relief  Pt states she used an enema today and it made her mouth get dry and she got short of breath  Pt states she is having a lot of discomfort

## 2016-02-24 NOTE — ED Provider Notes (Signed)
CSN: [id number]     Arrival date & time 02/24/16  1900 History   First MD Initiated Contact with Patient 02/24/16 1940     Chief Complaint  Patient presents with  . Constipation     (Consider location/radiation/quality/duration/timing/severity/associated sxs/prior Treatment) HPI   Blood pressure 148/76, pulse 80, temperature 98.4 F (36.9 C), temperature source Oral, resp. rate 18, height 5\' 10"  (1.778 m), weight 126.554 kg, SpO2 99 %.  Appollonia J Abbe AmsterdamHopkins is a 65 y.o. female complaining of constipation for the last 6 days, has not had a good bowel movement and that time. She is eating and drinking with no fever nausea or vomiting, of note, this patient is on a diet and she is eating concentrated organic supplements and occasionally supplementing it with salads or blueberries. Patient used mag ox, she took a total of 6 pills over the last several days. Patient also used an enema today and she felt her mouth gets dry and get short of breath, she had no itching, nausea, vomiting, shortness of breath, lip or tongue swelling. Patient states that she had a good bowel movement just prior to my assessment.  Past Medical History  Diagnosis Date  . Hypertension   . Arthritis    Past Surgical History  Procedure Laterality Date  . Partial hysterectomy     Family History  Problem Relation Age of Onset  . Stroke Mother   . Heart attack Mother   . Heart attack Father    Social History  Substance Use Topics  . Smoking status: Never Smoker   . Smokeless tobacco: None  . Alcohol Use: No   OB History    No data available     Review of Systems  10 systems reviewed and found to be negative, except as noted in the HPI.   Allergies  Lisinopril and Sulfa antibiotics  Home Medications   Prior to Admission medications   Medication Sig Start Date End Date Taking? Authorizing Provider  cholecalciferol (VITAMIN D) 1000 UNITS tablet Take 3,000 Units by mouth daily.     Historical Provider, MD   hydrochlorothiazide (HYDRODIURIL) 25 MG tablet Take 25 mg by mouth daily.    Historical Provider, MD  HYDROcodone-acetaminophen (NORCO/VICODIN) 5-325 MG per tablet Take 1 tablet by mouth every 6 (six) hours as needed for severe pain. 10/22/13   Fayrene HelperBowie Tran, PA-C  Multiple Vitamins-Minerals (HAIR/SKIN/NAILS PO) Take 3 capsules by mouth daily.    Historical Provider, MD  naproxen sodium (ANAPROX) 220 MG tablet Take 220 mg by mouth daily as needed (arthritis/pain).    Historical Provider, MD   BP 148/76 mmHg  Pulse 80  Temp(Src) 98.4 F (36.9 C) (Oral)  Resp 18  Ht 5\' 10"  (1.778 m)  Wt 126.554 kg  BMI 40.03 kg/m2  SpO2 99% Physical Exam  Constitutional: She is oriented to person, place, and time. She appears well-developed and well-nourished. No distress.  HENT:  Head: Normocephalic and atraumatic.  Mouth/Throat: Oropharynx is clear and moist. No oropharyngeal exudate.  Eyes: Conjunctivae and EOM are normal. Pupils are equal, round, and reactive to light.  Neck: Normal range of motion.  Cardiovascular: Normal rate, regular rhythm and intact distal pulses.   Pulmonary/Chest: Effort normal and breath sounds normal. No respiratory distress. She has no wheezes. She has no rales. She exhibits no tenderness.  No stridor or drooling. No posterior pharynx edema, lip or tongue swelling. Pt reclining comfortably, speaking in complete sentences.   No Wheezing, excellent air movement in all fields.  Abdominal: Soft. Bowel sounds are normal. She exhibits no distension and no mass. There is no tenderness. There is no rebound and no guarding.  Musculoskeletal: Normal range of motion.  Neurological: She is alert and oriented to person, place, and time.  Skin: She is not diaphoretic.  Psychiatric: She has a normal mood and affect.  Nursing note and vitals reviewed.   ED Course  Procedures (including critical care time) Labs Review Labs Reviewed - No data to display  Imaging Review No results  found. I have personally reviewed and evaluated these images and lab results as part of my medical decision-making.   EKG Interpretation None      MDM   Final diagnoses:  Slow transit constipation    Filed Vitals:   02/24/16 1924  BP: 148/76  Pulse: 80  Temp: 98.4 F (36.9 C)  TempSrc: Oral  Resp: 18  Height: 5\' 10"  (1.778 m)  Weight: 126.554 kg  SpO2: 99%    Venetia J Laminack is 65 y.o. female presenting with Constipation over the course of 6 days, noticed patient is dieting and has been using essentially nutrition supplements occasional salads and blueberries. She used a suppository and had a bowel movement afterwards, she felt short of breath and had a dry mouth after using suppository, she had nervous and called 911. On my exam she has no angioedema, lung sounds are clear to auscultation, no hives no nausea or vomiting. I think that her constipation is likely contributed to by her lack of oral intake, advised her to take fresh fruits and vegetables with had a discussion of return precautions. Patient verbalized her understanding.  Evaluation does not show pathology that would require ongoing emergent intervention or inpatient treatment. Pt is hemodynamically stable and mentating appropriately. Discussed findings and plan with patient/guardian, who agrees with care plan. All questions answered. Return precautions discussed and outpatient follow up given.        Wynetta Emeryicole Kameron Blethen, PA-C 02/24/16 2001  Lavera Guiseana Duo Liu, MD 02/25/16 1536

## 2016-02-24 NOTE — Discharge Instructions (Signed)
Take magnesium citrate 150mg  every 12 hours. You should continue to take Senakot. If this you have no relief in the the next 24 hours you should start taking miralax until you have a bowel movement. You may also try a fleet enema which are available over -the counter  Please follow with your primary care doctor in the next 2 days for a check-up. They must obtain records for further management.   Do not hesitate to return to the Emergency Department for any new, worsening or concerning symptoms.     Constipation, Adult Constipation is when a person has fewer than three bowel movements a week, has difficulty having a bowel movement, or has stools that are dry, hard, or larger than normal. As people grow older, constipation is more common. A low-fiber diet, not taking in enough fluids, and taking certain medicines may make constipation worse.  CAUSES   Certain medicines, such as antidepressants, pain medicine, iron supplements, antacids, and water pills.   Certain diseases, such as diabetes, irritable bowel syndrome (IBS), thyroid disease, or depression.   Not drinking enough water.   Not eating enough fiber-rich foods.   Stress or travel.   Lack of physical activity or exercise.   Ignoring the urge to have a bowel movement.   Using laxatives too much.  SIGNS AND SYMPTOMS   Having fewer than three bowel movements a week.   Straining to have a bowel movement.   Having stools that are hard, dry, or larger than normal.   Feeling full or bloated.   Pain in the lower abdomen.   Not feeling relief after having a bowel movement.  DIAGNOSIS  Your health care provider will take a medical history and perform a physical exam. Further testing may be done for severe constipation. Some tests may include:  A barium enema X-ray to examine your rectum, colon, and, sometimes, your small intestine.   A sigmoidoscopy to examine your lower colon.   A colonoscopy to examine your  entire colon. TREATMENT  Treatment will depend on the severity of your constipation and what is causing it. Some dietary treatments include drinking more fluids and eating more fiber-rich foods. Lifestyle treatments may include regular exercise. If these diet and lifestyle recommendations do not help, your health care provider may recommend taking over-the-counter laxative medicines to help you have bowel movements. Prescription medicines may be prescribed if over-the-counter medicines do not work.  HOME CARE INSTRUCTIONS   Eat foods that have a lot of fiber, such as fruits, vegetables, whole grains, and beans.  Limit foods high in fat and processed sugars, such as french fries, hamburgers, cookies, candies, and soda.   A fiber supplement may be added to your diet if you cannot get enough fiber from foods.   Drink enough fluids to keep your urine clear or pale yellow.   Exercise regularly or as directed by your health care provider.   Go to the restroom when you have the urge to go. Do not hold it.   Only take over-the-counter or prescription medicines as directed by your health care provider. Do not take other medicines for constipation without talking to your health care provider first.  SEEK IMMEDIATE MEDICAL CARE IF:   You have bright red blood in your stool.   Your constipation lasts for more than 4 days or gets worse.   You have abdominal or rectal pain.   You have thin, pencil-like stools.   You have unexplained weight loss. MAKE SURE YOU:  Understand these instructions.  Will watch your condition.  Will get help right away if you are not doing well or get worse.   This information is not intended to replace advice given to you by your health care provider. Make sure you discuss any questions you have with your health care provider.   Document Released: 05/09/2004 Document Revised: 09/01/2014 Document Reviewed: 05/23/2013 Elsevier Interactive Patient  Education [employer]2016 Elsevier Inc.

## 2016-02-27 ENCOUNTER — Ambulatory Visit: Payer: Self-pay

## 2016-02-27 ENCOUNTER — Other Ambulatory Visit: Payer: Self-pay

## 2016-05-20 ENCOUNTER — Inpatient Hospital Stay: Admission: RE | Admit: 2016-05-20 | Payer: Self-pay | Source: Ambulatory Visit

## 2016-05-20 ENCOUNTER — Ambulatory Visit: Payer: Self-pay

## 2017-05-14 ENCOUNTER — Ambulatory Visit: Payer: Commercial Managed Care - PPO | Admitting: Internal Medicine

## 2017-05-26 ENCOUNTER — Encounter: Payer: Self-pay | Admitting: Internal Medicine

## 2017-05-26 ENCOUNTER — Ambulatory Visit (INDEPENDENT_AMBULATORY_CARE_PROVIDER_SITE_OTHER): Payer: Medicare Other | Admitting: Internal Medicine

## 2017-05-26 ENCOUNTER — Encounter (INDEPENDENT_AMBULATORY_CARE_PROVIDER_SITE_OTHER): Payer: Self-pay

## 2017-05-26 ENCOUNTER — Ambulatory Visit (INDEPENDENT_AMBULATORY_CARE_PROVIDER_SITE_OTHER)
Admission: RE | Admit: 2017-05-26 | Discharge: 2017-05-26 | Disposition: A | Discharge status: Home / Self Care | Payer: Medicare Other | Source: Ambulatory Visit | Attending: Internal Medicine | Admitting: Internal Medicine

## 2017-05-26 VITALS — BP 128/78 | HR 71 | Temp 97.9°F | Wt 231.0 lb

## 2017-05-26 DIAGNOSIS — M170 Bilateral primary osteoarthritis of knee: Secondary | ICD-10-CM | POA: Diagnosis not present

## 2017-05-26 DIAGNOSIS — M5441 Lumbago with sciatica, right side: Secondary | ICD-10-CM | POA: Diagnosis not present

## 2017-05-26 DIAGNOSIS — I10 Essential (primary) hypertension: Secondary | ICD-10-CM | POA: Diagnosis not present

## 2017-05-26 DIAGNOSIS — Z23 Encounter for immunization: Secondary | ICD-10-CM | POA: Diagnosis not present

## 2017-05-26 MED ORDER — GABAPENTIN 100 MG PO CAPS: 100.0000 mg | ORAL_CAPSULE | Freq: Three times a day (TID) | ORAL | 2 refills | Status: DC

## 2017-05-26 NOTE — Progress Notes (Signed)
HPI  Pt presents to the clinic today to establish care and for management of the conditions listed below. She is transferring care from [facility].  HTN: Her BP today is 128/78. She is taking Chlorthalidone, and HCTZ as prescribed. There is no ECG on file.  Arthritis: Mainly in her knees. She is taking Diclofenac and Hydrocodone but reports she continues to have knee pain. She is not interested in a knee replacement. She has been thinking about having stem cells injected into her knee. She does not see orthopedics.  Her biggest complaint at this time is bilateral leg pain. She describes the pain as sharp and burning. The pain starts in her right leg and sometimes occurs in her left leg. She can not tell if is starts in her feet and goes up or starts at her hip and goes down. She denies numbness, tingling or weakness. She dies report some mild low back pain. She denies issues with bowel, bladder or difficulty with gait. Her symptoms seem worse at night. She reports she has taken Percocet and muscle relaxer, given to her by her son, with some relief.  Flu: 05/2016 Tetanus: 04/2012 Pneumovax: never Prevnar: 08/2016 Zostovax: 04/2012 Pap Smear: 08/2014, partial hysterectomy Mammogram: 01/2015 Bone Density Exam: 2014 Colon Screening: 02/2017 Vision Screening: 08/2016 Dentist: biannually  Past Medical History:  Diagnosis Date  . Arthritis   . Hypertension     Current Outpatient Prescriptions  Medication Sig Dispense Refill  . HYDROcodone-acetaminophen (NORCO/VICODIN) 5-325 MG tablet Take by mouth.    . chlorthalidone (HYGROTON) 25 MG tablet     . cholecalciferol (VITAMIN D) 1000 UNITS tablet Take 3,000 Units by mouth daily.     . diclofenac (VOLTAREN) 75 MG EC tablet     . hydrochlorothiazide (HYDRODIURIL) 25 MG tablet Take 25 mg by mouth daily.    . Multiple Vitamins-Minerals (HAIR/SKIN/NAILS PO) Take 3 capsules by mouth daily.    . naproxen sodium (ANAPROX) 220 MG tablet Take 220 mg  by mouth daily as needed (arthritis/pain).    . traMADol (ULTRAM) 50 MG tablet      No current facility-administered medications for this visit.     Allergies  Allergen Reactions  . Lisinopril Shortness Of Breath and Swelling  . Sulfa Antibiotics Swelling    Family History  Problem Relation Age of Onset  . Stroke Mother   . Heart attack Mother   . Heart attack Father     Social History   Social History  . Marital status: Married    Spouse name: N/A  . Number of children: N/A  . Years of education: N/A   Occupational History  . Not on file.   Social History Main Topics  . Smoking status: Former Games developer  . Smokeless tobacco: Never Used  . Alcohol use No  . Drug use: No  . Sexual activity: Not on file   Other Topics Concern  . Not on file   Social History Narrative  . No narrative on file    ROS:  Constitutional: Denies fever, malaise, fatigue, headache or abrupt weight changes.  HEENT: Denies eye pain, eye redness, ear pain, ringing in the ears, wax buildup, runny nose, nasal congestion, bloody nose, or sore throat. Respiratory: Denies difficulty breathing, shortness of breath, cough or sputum production.   Cardiovascular: Denies chest pain, chest tightness, palpitations or swelling in the hands or feet.  Gastrointestinal: Denies abdominal pain, bloating, constipation, diarrhea or blood in the stool.  GU: Denies frequency, urgency, pain  with urination, blood in urine, odor or discharge. Musculoskeletal: Pt reports low back pain, bilateral knee pain. Denies decrease in range of motion, difficulty with gait, or joint swelling.  Skin: Denies redness, rashes, lesions or ulcercations.  Neurological: Denies dizziness, difficulty with memory, difficulty with speech or problems with balance and coordination.  Psych: Denies anxiety, depression, SI/HI.  No other specific complaints in a complete review of systems (except as listed in HPI above).  PE:  BP 128/78    Pulse 71   Temp 97.9 F (36.6 C) (Oral)   Wt 231 lb (104.8 kg)   SpO2 97%   BMI 33.15 kg/m   Wt Readings from Last 3 Encounters:  02/24/16 279 lb (126.6 kg)  10/22/13 274 lb 8 oz (124.5 kg)    General: Appears her stated age, obese, in NAD. Cardiovascular: Normal rate and rhythm.  Pulmonary/Chest: Normal effort and positive vesicular breath sounds. No respiratory distress. No wheezes, rales or ronchi noted.  Musculoskeletal: Normal flexion, extension, rotation and lateral bending of the spine. Pain with palpation over the lumbar spine. Strength 5/5 BLE. Knee joints with obvious enlargement. She is able to walk on tiptoes and heels. No difficulty with gait.  Neurological: Alert and oriented. Psychiatric: Mood and affect normal. Behavior is normal. Judgment and thought content normal.     Assessment and Plan:  Low Back Pain with Possible Right Side Sciatica:  Xray lumbar spine today Continue Diclofenac Start Gabapentin 100 mg TID prn- sedation caution given Stretching exercises given  Will follow up after xray and labs Nicki Reaper, NP

## 2017-05-27 DIAGNOSIS — I10 Essential (primary) hypertension: Secondary | ICD-10-CM | POA: Insufficient documentation

## 2017-05-27 DIAGNOSIS — M179 Osteoarthritis of knee, unspecified: Secondary | ICD-10-CM | POA: Insufficient documentation

## 2017-05-27 DIAGNOSIS — M1710 Unilateral primary osteoarthritis, unspecified knee: Secondary | ICD-10-CM | POA: Insufficient documentation

## 2017-05-27 LAB — BASIC METABOLIC PANEL
BUN: 16 mg/dL (ref 6–23)
CO2: 32 meq/L (ref 19–32)
Calcium: 9.6 mg/dL (ref 8.4–10.5)
Chloride: 100 mEq/L (ref 96–112)
Creatinine, Ser: 0.72 mg/dL (ref 0.40–1.20)
GFR: 104.21 mL/min (ref >60.00)
GLUCOSE: 74 mg/dL (ref 70–99)
POTASSIUM: 3.7 meq/L (ref 3.5–5.1)
SODIUM: 140 meq/L (ref 135–145)

## 2017-05-27 NOTE — Patient Instructions (Signed)

## 2017-05-27 NOTE — Addendum Note (Signed)
Addended by: Roena Malady on: 05/27/2017 05:10 PM   Modules accepted: Orders

## 2017-05-27 NOTE — Assessment & Plan Note (Signed)
I don't like that she is on 2 diuretics CMET today to monitor kidney function Will get ECG with annual exam

## 2017-05-27 NOTE — Assessment & Plan Note (Signed)
Discussed how weight loss could help improve her knee pain Continue Diflofenac Advised her that I do not prescribe Norco for chronic knee pain She may want to consider seeing an orthopedist- she wants to look for one that specializes in stem cells Encouraged her to stay active

## 2017-06-01 ENCOUNTER — Telehealth: Payer: Self-pay

## 2017-06-01 MED ORDER — TRAMADOL HCL 50 MG PO TABS: 50.0000 mg | ORAL_TABLET | Freq: Two times a day (BID) | ORAL | 0 refills | Status: DC | PRN

## 2017-06-01 NOTE — Telephone Encounter (Signed)
Pt left v/m requesting cb with med for knee pain; pt presently taking hydrocodone apap. Pt said gabapentin is helping leg pain but not knee pain. Pt will be going out of town on 06/02/17 and pt request cb today. [facility]. Pt is aware R Baity NP does not prescribe opioids. Pt established care on 05/26/17.

## 2017-06-01 NOTE — Addendum Note (Signed)
Addended by: Lorre Munroe on: 06/01/2017 10:57 AM   Modules accepted: Orders

## 2017-06-01 NOTE — Telephone Encounter (Signed)
Rx called to pharmacy as instructed. Left detailed message on voicemail with instructions per Nicki Reaper NP. (on DPR)

## 2017-06-01 NOTE — Telephone Encounter (Signed)
Ok to phone in Tramadol (as listed on MAR) to be used as needed for severe pain only. I would discourage her from using it daily. She should continue Voltaren.

## 2017-07-09 ENCOUNTER — Telehealth: Payer: Self-pay

## 2017-07-09 NOTE — Telephone Encounter (Signed)
Immunizations have been faxed and pt is aware

## 2017-07-09 NOTE — Telephone Encounter (Signed)
Copied from CRM [phone]. Topic: General - Other >> Jul 09, 2017  3:15 PM Landry MellowFoltz, Melissa J wrote: Reason for CRM: pt needs proof of flu shot for this year . [facility]  [facility] number [phone]. Put To Venora MaplesCynthia Neill attention  Cb number is [phone]

## 2017-08-11 ENCOUNTER — Ambulatory Visit: Payer: Medicare Other | Admitting: Family Medicine

## 2017-10-05 ENCOUNTER — Other Ambulatory Visit: Payer: Self-pay | Admitting: Family Medicine

## 2017-10-05 DIAGNOSIS — Z139 Encounter for screening, unspecified: Secondary | ICD-10-CM

## 2017-10-05 DIAGNOSIS — R5381 Other malaise: Secondary | ICD-10-CM

## 2017-11-02 ENCOUNTER — Other Ambulatory Visit: Payer: Self-pay | Admitting: Family Medicine

## 2017-11-02 DIAGNOSIS — M8580 Other specified disorders of bone density and structure, unspecified site: Secondary | ICD-10-CM

## 2017-11-25 ENCOUNTER — Ambulatory Visit
Admission: RE | Admit: 2017-11-25 | Discharge: 2017-11-25 | Disposition: A | Discharge status: Home / Self Care | Payer: Medicare HMO | Source: Ambulatory Visit | Attending: Family Medicine | Admitting: Family Medicine

## 2017-11-25 ENCOUNTER — Ambulatory Visit
Admission: RE | Admit: 2017-11-25 | Discharge: 2017-11-25 | Disposition: A | Discharge status: Home / Self Care | Payer: PRIVATE HEALTH INSURANCE | Source: Ambulatory Visit | Attending: Family Medicine | Admitting: Family Medicine

## 2017-11-25 DIAGNOSIS — M8580 Other specified disorders of bone density and structure, unspecified site: Secondary | ICD-10-CM

## 2017-11-25 DIAGNOSIS — Z139 Encounter for screening, unspecified: Secondary | ICD-10-CM

## 2018-02-01 ENCOUNTER — Other Ambulatory Visit: Payer: Self-pay | Admitting: Family Medicine

## 2018-02-01 NOTE — Telephone Encounter (Signed)
Patient never saw  Dr. Mardelle MatteAndy, had appt but canceled it.   Please advise.

## 2018-02-04 NOTE — Patient Instructions (Addendum)
Peggy Harrell  02/04/2018   Your procedure is scheduled on: 02-15-18   Report to [facility] Main  Entrance               Report to admitting at     0700 AM    Call this number if you have problems the morning of surgery [phone]    Remember: Do not eat food or drink liquids :After Midnight.     Take these medicines the morning of surgery with A SIP OF WATER: hydrocodone if needed                                You may not have any metal on your body including hair pins and              piercings  Do not wear jewelry, make-up, lotions, powders or perfumes, deodorant             Do not wear nail polish.  Do not shave  48 hours prior to surgery.     Do not bring valuables to the hospital. [facility] IS NOT             RESPONSIBLE   FOR VALUABLES.  Contacts, dentures or bridgework may not be worn into surgery.  Leave suitcase in the car. After surgery it may be brought to your room.                 Please read over the following fact sheets you were given: _____________________________________________________________________           [facility] - Preparing for Surgery Before surgery, you can play an important role.  Because skin is not sterile, your skin needs to be as free of germs as possible.  You can reduce the number of germs on your skin by washing with CHG (chlorahexidine gluconate) soap before surgery.  CHG is an antiseptic cleaner which kills germs and bonds with the skin to continue killing germs even after washing. Please DO NOT use if you have an allergy to CHG or antibacterial soaps.  If your skin becomes reddened/irritated stop using the CHG and inform your nurse when you arrive at Short Stay. Do not shave (including legs and underarms) for at least 48 hours prior to the first CHG shower.  You may shave your face/neck. Please follow these instructions carefully:  1.  Shower with CHG Soap the night before surgery and the  morning of  Surgery.  2.  If you choose to wash your hair, wash your hair first as usual with your  normal  shampoo.  3.  After you shampoo, rinse your hair and body thoroughly to remove the  shampoo.                           4.  Use CHG as you would any other liquid soap.  You can apply chg directly  to the skin and wash                       Gently with a scrungie or clean washcloth.  5.  Apply the CHG Soap to your body ONLY FROM THE NECK DOWN.   Do not use on face/ open  Wound or open sores. Avoid contact with eyes, ears mouth and genitals (private parts).                       Wash face,  Genitals (private parts) with your normal soap.             6.  Wash thoroughly, paying special attention to the area where your surgery  will be performed.  7.  Thoroughly rinse your body with warm water from the neck down.  8.  DO NOT shower/wash with your normal soap after using and rinsing off  the CHG Soap.                9.  Pat yourself dry with a clean towel.            10.  Wear clean pajamas.            11.  Place clean sheets on your bed the night of your first shower and do not  sleep with pets. Day of Surgery : Do not apply any lotions/deodorants the morning of surgery.  Please wear clean clothes to the hospital/surgery center.  FAILURE TO FOLLOW THESE INSTRUCTIONS MAY RESULT IN THE CANCELLATION OF YOUR SURGERY PATIENT SIGNATURE_________________________________  NURSE SIGNATURE__________________________________  ________________________________________________________________________  WHAT IS A BLOOD TRANSFUSION? Blood Transfusion Information  A transfusion is the replacement of blood or some of its parts. Blood is made up of multiple cells which provide different functions.  Red blood cells carry oxygen and are used for blood loss replacement.  White blood cells fight against infection.  Platelets control bleeding.  Plasma helps clot blood.  Other blood products are  available for specialized needs, such as hemophilia or other clotting disorders. BEFORE THE TRANSFUSION  Who gives blood for transfusions?   Healthy volunteers who are fully evaluated to make sure their blood is safe. This is blood bank blood. Transfusion therapy is the safest it has ever been in the practice of medicine. Before blood is taken from a donor, a complete history is taken to make sure that person has no history of diseases nor engages in risky social behavior (examples are intravenous drug use or sexual activity with multiple partners). The donor's travel history is screened to minimize risk of transmitting infections, such as malaria. The donated blood is tested for signs of infectious diseases, such as HIV and hepatitis. The blood is then tested to be sure it is compatible with you in order to minimize the chance of a transfusion reaction. If you or a relative donates blood, this is often done in anticipation of surgery and is not appropriate for emergency situations. It takes many days to process the donated blood. RISKS AND COMPLICATIONS Although transfusion therapy is very safe and saves many lives, the main dangers of transfusion include:   Getting an infectious disease.  Developing a transfusion reaction. This is an allergic reaction to something in the blood you were given. Every precaution is taken to prevent this. The decision to have a blood transfusion has been considered carefully by your caregiver before blood is given. Blood is not given unless the benefits outweigh the risks. AFTER THE TRANSFUSION  Right after receiving a blood transfusion, you will usually feel much better and more energetic. This is especially true if your red blood cells have gotten low (anemic). The transfusion raises the level of the red blood cells which carry oxygen, and this usually causes an energy increase.  The  nurse administering the transfusion will monitor you carefully for  complications. HOME CARE INSTRUCTIONS  No special instructions are needed after a transfusion. You may find your energy is better. Speak with your caregiver about any limitations on activity for underlying diseases you may have. SEEK MEDICAL CARE IF:   Your condition is not improving after your transfusion.  You develop redness or irritation at the intravenous (IV) site. SEEK IMMEDIATE MEDICAL CARE IF:  Any of the following symptoms occur over the next 12 hours:  Shaking chills.  You have a temperature by mouth above 102 F (38.9 C), not controlled by medicine.  Chest, back, or muscle pain.  People around you feel you are not acting correctly or are confused.  Shortness of breath or difficulty breathing.  Dizziness and fainting.  You get a rash or develop hives.  You have a decrease in urine output.  Your urine turns a dark color or changes to pink, red, or brown. Any of the following symptoms occur over the next 10 days:  You have a temperature by mouth above 102 F (38.9 C), not controlled by medicine.  Shortness of breath.  Weakness after normal activity.  The white part of the eye turns yellow (jaundice).  You have a decrease in the amount of urine or are urinating less often.  Your urine turns a dark color or changes to pink, red, or brown. Document Released: 08/08/2000 Document Revised: 11/03/2011 Document Reviewed: 03/27/2008 ExitCare Patient Information 2014 [place].  _______________________________________________________________________  Incentive Spirometer  An incentive spirometer is a tool that can help keep your lungs clear and active. This tool measures how well you are filling your lungs with each breath. Taking long deep breaths may help reverse or decrease the chance of developing breathing (pulmonary) problems (especially infection) following:  A long period of time when you are unable to move or be active. BEFORE THE PROCEDURE   If  the spirometer includes an indicator to show your best effort, your nurse or respiratory therapist will set it to a desired goal.  If possible, sit up straight or lean slightly forward. Try not to slouch.  Hold the incentive spirometer in an upright position. INSTRUCTIONS FOR USE  1. Sit on the edge of your bed if possible, or sit up as far as you can in bed or on a chair. 2. Hold the incentive spirometer in an upright position. 3. Breathe out normally. 4. Place the mouthpiece in your mouth and seal your lips tightly around it. 5. Breathe in slowly and as deeply as possible, raising the piston or the ball toward the top of the column. 6. Hold your breath for 3-5 seconds or for as long as possible. Allow the piston or ball to fall to the bottom of the column. 7. Remove the mouthpiece from your mouth and breathe out normally. 8. Rest for a few seconds and repeat Steps 1 through 7 at least 10 times every 1-2 hours when you are awake. Take your time and take a few normal breaths between deep breaths. 9. The spirometer may include an indicator to show your best effort. Use the indicator as a goal to work toward during each repetition. 10. After each set of 10 deep breaths, practice coughing to be sure your lungs are clear. If you have an incision (the cut made at the time of surgery), support your incision when coughing by placing a pillow or rolled up towels firmly against it. Once you are able to get  out of bed, walk around indoors and cough well. You may stop using the incentive spirometer when instructed by your caregiver.  RISKS AND COMPLICATIONS  Take your time so you do not get dizzy or light-headed.  If you are in pain, you may need to take or ask for pain medication before doing incentive spirometry. It is harder to take a deep breath if you are having pain. AFTER USE  Rest and breathe slowly and easily.  It can be helpful to keep track of a log of your progress. Your caregiver can  provide you with a simple table to help with this. If you are using the spirometer at home, follow these instructions: [place] IF:   You are having difficultly using the spirometer.  You have trouble using the spirometer as often as instructed.  Your pain medication is not giving enough relief while using the spirometer.  You develop fever of 100.5 F (38.1 C) or higher. SEEK IMMEDIATE MEDICAL CARE IF:   You cough up bloody sputum that had not been present before.  You develop fever of 102 F (38.9 C) or greater.  You develop worsening pain at or near the incision site. MAKE SURE YOU:   Understand these instructions.  Will watch your condition.  Will get help right away if you are not doing well or get worse. Document Released: 12/22/2006 Document Revised: 11/03/2011 Document Reviewed: 02/22/2007 [facility] Patient Information 2014 [place], [place].   ________________________________________________________________________

## 2018-02-05 NOTE — H&P (Signed)
TOTAL KNEE ADMISSION H&P  Patient is being admitted for right total knee arthroplasty.  Subjective:  Chief Complaint:right knee pain.  HPI: [employer] Data SystemsFlorry J Harrell, 67 y.o. female, has a history of pain and functional disability in the right knee due to arthritis and has failed non-surgical conservative treatments for greater than 12 weeks to includeNSAID's and/or analgesics, corticosteriod injections, viscosupplementation injections and activity modification.  Onset of symptoms was gradual, starting several years ago with gradually worsening course since that time. The patient noted no past surgery on the right knee(s).  Patient currently rates pain in the right knee(s) at 7 out of 10 with activity. Patient has worsening of pain with activity and weight bearing, pain that interferes with activities of daily living and and instability of the right knee..  Patient has evidence of joint space narrowing and varus deformity by imaging studies.There is no active infection.  Patient Active Problem List   Diagnosis Date Noted  . Essential hypertension 05/27/2017  . Osteoarthritis, knee 05/27/2017   Past Medical History:  Diagnosis Date  . Anemia    hx of  . Arthritis   . Complication of anesthesia    Hard to wake up  . Heart murmur   . Hypertension   . Pneumonia    as a child    Past Surgical History:  Procedure Laterality Date  . JOINT REPLACEMENT      Scheduled Right total knee Dr. Lequita HaltAluisio 02-15-18  . PARTIAL HYSTERECTOMY    . TONSILLECTOMY      No current facility-administered medications for this encounter.    Current Outpatient Medications  Medication Sig Dispense Refill Last Dose  . b complex vitamins tablet Take 1 tablet by mouth daily.     . chlorthalidone (HYGROTON) 25 MG tablet Take 25 mg by mouth daily.    Taking  . cholecalciferol (VITAMIN D) 1000 units tablet Take 7,000 Units by mouth daily.    Taking  . diclofenac (VOLTAREN) 75 MG EC tablet Take 75 mg by mouth 2 (two) times  daily.    Taking  . gabapentin (NEURONTIN) 100 MG capsule Take 1 capsule (100 mg total) by mouth 3 (three) times daily. (Patient taking differently: Take 100 mg by mouth at bedtime. ) 90 capsule 2   . HAWTHORN PO Take 5 tablets by mouth 2 (two) times daily.     Marland Kitchen. HYDROcodone-acetaminophen (NORCO/VICODIN) 5-325 MG tablet Take 1 tablet by mouth 2 (two) times daily as needed for moderate pain.     Marland Kitchen. OVER THE COUNTER MEDICATION Take 2 each by mouth daily. Vitamin B12 with COQ 10 gummy     . traMADol (ULTRAM) 50 MG tablet Take 1 tablet (50 mg total) by mouth every 12 (twelve) hours as needed. (Patient not taking: Reported on 02/03/2018) 20 tablet 0 Completed Course at Unknown time   Allergies  Allergen Reactions  . Celecoxib Tinitus  . Lisinopril Shortness Of Breath and Swelling  . Sulfa Antibiotics Swelling    Social History   Tobacco Use  . Smoking status: Former Games developermoker  . Smokeless tobacco: Never Used  . Tobacco comment: quit 25 years ago  Substance Use Topics  . Alcohol use: No    Family History  Problem Relation Age of Onset  . Stroke Mother   . Heart attack Mother   . Heart attack Father      Review of Systems  Constitutional: Negative for chills and fever.  HENT: Negative for congestion, sore throat and tinnitus.   Eyes: Negative  for double vision, photophobia and pain.  Respiratory: Negative for cough, shortness of breath and wheezing.   Cardiovascular: Negative for chest pain, palpitations and orthopnea.  Gastrointestinal: Negative for heartburn, nausea and vomiting.  Genitourinary: Negative for dysuria, frequency and urgency.  Musculoskeletal: Positive for joint pain.  Neurological: Negative for dizziness, weakness and headaches.  Psychiatric/Behavioral: Negative for depression.    Objective:  Patient is a 67 year old female. Obese and well developed. General: Alert and oriented x3, cooperative and pleasant, no acute distress. Head: normocephalic, atraumatic, neck  supple. Eyes: EOMI. Respiratory: breath sounds clear in all fields, no wheezing, rales, or rhonchi. Cardiovascular: Regular rate and rhythm, no murmurs, gallops or rubs.  Abdomen: non-tender to palpation and soft, normoactive bowel sounds. Muskuloskeletal: Antalgic gait without the use of assisted devices.  Bilateral Hip Exam: ROM: is normal.  There is no tenderness over the greater trochanter.  There is no pain on provocative testing of the hip. Right Knee Exam:  No effusion. Varus deformity. Range of motion is 10-125 degrees.  Moderate crepitus on range of motion of the knee.  Medial and lateral joint line tenderness, greater in the medial.  Stable knee.  Left Knee Exam:  No effusion.  Range of motion is 5-125 degrees.  Moderate crepitus on range of motion of the knee.  Medial and lateral joint line tenderness, greater in the lateral.  Stable knee. Calves soft and nontender. Motor function intact in LE. Strength 5/5 LE bilaterally. Neuro: Distal pulses 2+. Sensation to light touch intact in LE.  Vital signs in last 24 hours:  BP: 138/86 mmHg Pulse: 64 bpm  Labs:   Estimated body mass index is 35.36 kg/m as calculated from the following:   Height as of 02/09/18: 5' 8.5" (1.74 m).   Weight as of 02/09/18: 107 kg (236 lb).   Imaging Review Plain radiographs demonstrate severe degenerative joint disease of the right knee(s). The overall alignment isvarus. The bone quality appears to be adequate for age and reported activity level.   Preoperative templating of the joint replacement has been completed, documented, and submitted to the Operating Room personnel in order to optimize intra-operative equipment management.   Anticipated LOS equal to or greater than 2 midnights due to - Age 57 and older with one or more of the following:  - Obesity  - Expected need for hospital services (PT, OT, Nursing) required for safe  discharge  - Anticipated need for postoperative skilled  nursing care or inpatient rehab  - Active co-morbidities: None OR   - Unanticipated findings during/Post Surgery: None  - Patient is a high risk of re-admission due to: None     Assessment/Plan:  End stage arthritis, right knee   The patient history, physical examination, clinical judgment of the provider and imaging studies are consistent with end stage degenerative joint disease of the right knee(s) and total knee arthroplasty is deemed medically necessary. The treatment options including medical management, injection therapy arthroscopy and arthroplasty were discussed at length. The risks and benefits of total knee arthroplasty were presented and reviewed. The risks due to aseptic loosening, infection, stiffness, patella tracking problems, thromboembolic complications and other imponderables were discussed. The patient acknowledged the explanation, agreed to proceed with the plan and consent was signed. Patient is being admitted for inpatient treatment for surgery, pain control, PT, OT, prophylactic antibiotics, VTE prophylaxis, progressive ambulation and ADL's and discharge planning. The patient is planning to be discharged to home with outpatient physical therapy.   Therapy Plans: outpatient  therapy at [facility] Disposition: home with family Planned DVT prophylaxis: aspirin 325 mg BID DME needed: walker, 3-n-1 PCP: Nadyne Coombes TXA: IV  - Patient was instructed on what medications to stop prior to surgery. - Follow-up visit in 2 weeks with Dr. Lequita Halt - Begin physical therapy following surgery - Pre-operative lab work as pre-surgical testing - Prescriptions will be provided in hospital at time of discharge  Arther Abbott, PA-C Orthopaedic Surgery [facility] [place]

## 2018-02-09 ENCOUNTER — Encounter (HOSPITAL_COMMUNITY): Payer: Self-pay

## 2018-02-09 ENCOUNTER — Encounter (HOSPITAL_COMMUNITY)
Admission: RE | Admit: 2018-02-09 | Discharge: 2018-02-09 | Disposition: A | Discharge status: Home / Self Care | Payer: Medicare HMO | Source: Ambulatory Visit | Attending: Orthopedic Surgery | Admitting: Orthopedic Surgery

## 2018-02-09 ENCOUNTER — Other Ambulatory Visit: Payer: Self-pay

## 2018-02-09 DIAGNOSIS — Z01810 Encounter for preprocedural cardiovascular examination: Secondary | ICD-10-CM | POA: Insufficient documentation

## 2018-02-09 DIAGNOSIS — R9431 Abnormal electrocardiogram [ECG] [EKG]: Secondary | ICD-10-CM | POA: Diagnosis not present

## 2018-02-09 HISTORY — DX: Anemia, unspecified: D64.9

## 2018-02-09 HISTORY — DX: Adverse effect of unspecified anesthetic, initial encounter: T41.45XA

## 2018-02-09 HISTORY — DX: Other complications of anesthesia, initial encounter: T88.59XA

## 2018-02-09 HISTORY — DX: Cardiac murmur, unspecified: R01.1

## 2018-02-09 HISTORY — DX: Pneumonia, unspecified organism: J18.9

## 2018-02-09 LAB — COMPREHENSIVE METABOLIC PANEL
ALT: 16 U/L (ref 14–54)
AST: 16 U/L (ref 15–41)
Albumin: 3.5 g/dL (ref 3.5–5.0)
Alkaline Phosphatase: 100 U/L (ref 38–126)
Anion gap: 5 (ref 5–15)
BUN: 15 mg/dL (ref 6–20)
CO2: 33 mmol/L — ABNORMAL HIGH (ref 22–32)
Calcium: 9.2 mg/dL (ref 8.9–10.3)
Chloride: 105 mmol/L (ref 101–111)
Creatinine, Ser: 0.76 mg/dL (ref 0.44–1.00)
GFR calc Af Amer: >60 mL/min (ref >60)
GFR calc non Af Amer: >60 mL/min (ref >60)
Glucose, Bld: 88 mg/dL (ref 65–99)
Potassium: 3.6 mmol/L (ref 3.5–5.1)
Sodium: 143 mmol/L (ref 135–145)
Total Bilirubin: 0.8 mg/dL (ref 0.3–1.2)
Total Protein: 6.8 g/dL (ref 6.5–8.1)

## 2018-02-09 LAB — PROTIME-INR
INR: 0.97
Prothrombin Time: 12.8 seconds (ref 11.4–15.2)

## 2018-02-09 LAB — URINALYSIS, ROUTINE W REFLEX MICROSCOPIC
Bilirubin Urine: NEGATIVE
Glucose, UA: NEGATIVE mg/dL
Ketones, ur: NEGATIVE mg/dL
Nitrite: POSITIVE — AB
Protein, ur: NEGATIVE mg/dL
Specific Gravity, Urine: 1.014 (ref 1.005–1.030)
WBC, UA: >50 WBC/hpf — ABNORMAL HIGH (ref 0–5)
pH: 6 (ref 5.0–8.0)

## 2018-02-09 LAB — CBC
HCT: 34.7 % — ABNORMAL LOW (ref 36.0–46.0)
Hemoglobin: 11.4 g/dL — ABNORMAL LOW (ref 12.0–15.0)
MCH: 28.6 pg (ref 26.0–34.0)
MCHC: 32.9 g/dL (ref 30.0–36.0)
MCV: 87.2 fL (ref 78.0–100.0)
Platelets: 318 10*3/uL (ref 150–400)
RBC: 3.98 MIL/uL (ref 3.87–5.11)
RDW: 16 % — ABNORMAL HIGH (ref 11.5–15.5)
WBC: 8.2 10*3/uL (ref 4.0–10.5)

## 2018-02-09 LAB — SURGICAL PCR SCREEN
MRSA, PCR: NEGATIVE
Staphylococcus aureus: POSITIVE — AB

## 2018-02-09 LAB — ABO/RH: ABO/RH(D): O POS

## 2018-02-09 LAB — APTT: aPTT: 34 seconds (ref 24–36)

## 2018-02-09 NOTE — Progress Notes (Signed)
LVM that prescription for mupiricion was [facility] [place] [facility] [place]

## 2018-02-14 MED ORDER — TRANEXAMIC ACID 1000 MG/10ML IV SOLN
1000.0000 mg | INTRAVENOUS | Status: AC
  Administered 2018-02-15: 1000 mg via INTRAVENOUS
  Filled 2018-02-14: qty 1100

## 2018-02-14 MED ORDER — BUPIVACAINE LIPOSOME 1.3 % IJ SUSP
20.0000 mL | INTRAMUSCULAR | Status: DC
  Filled 2018-02-14: qty 20

## 2018-02-15 ENCOUNTER — Encounter (HOSPITAL_COMMUNITY)
Admission: RE | Disposition: A | Discharge status: Home / Self Care | Payer: Self-pay | Source: Ambulatory Visit | Attending: Orthopedic Surgery

## 2018-02-15 ENCOUNTER — Inpatient Hospital Stay (HOSPITAL_COMMUNITY): Payer: Medicare HMO | Admitting: Anesthesiology

## 2018-02-15 ENCOUNTER — Other Ambulatory Visit: Payer: Self-pay

## 2018-02-15 ENCOUNTER — Encounter (HOSPITAL_COMMUNITY): Payer: Self-pay | Admitting: *Deleted

## 2018-02-15 ENCOUNTER — Inpatient Hospital Stay (HOSPITAL_COMMUNITY)
Admission: RE | Admit: 2018-02-15 | Discharge: 2018-02-17 | DRG: 470 | Disposition: A | Discharge status: Home / Self Care | Payer: Medicare HMO | Source: Ambulatory Visit | Attending: Orthopedic Surgery | Admitting: Orthopedic Surgery

## 2018-02-15 DIAGNOSIS — I10 Essential (primary) hypertension: Secondary | ICD-10-CM | POA: Diagnosis present

## 2018-02-15 DIAGNOSIS — Z87891 Personal history of nicotine dependence: Secondary | ICD-10-CM | POA: Diagnosis not present

## 2018-02-15 DIAGNOSIS — M1711 Unilateral primary osteoarthritis, right knee: Secondary | ICD-10-CM

## 2018-02-15 DIAGNOSIS — E669 Obesity, unspecified: Secondary | ICD-10-CM | POA: Diagnosis present

## 2018-02-15 DIAGNOSIS — M179 Osteoarthritis of knee, unspecified: Secondary | ICD-10-CM | POA: Diagnosis present

## 2018-02-15 DIAGNOSIS — Z6835 Body mass index (BMI) 35.0-35.9, adult: Secondary | ICD-10-CM

## 2018-02-15 DIAGNOSIS — M1710 Unilateral primary osteoarthritis, unspecified knee: Secondary | ICD-10-CM | POA: Diagnosis present

## 2018-02-15 HISTORY — PX: TOTAL KNEE ARTHROPLASTY: SHX125

## 2018-02-15 HISTORY — DX: Other specified postprocedural states: Z98.890

## 2018-02-15 HISTORY — DX: Nausea with vomiting, unspecified: R11.2

## 2018-02-15 LAB — TYPE AND SCREEN
ABO/RH(D): O POS
Antibody Screen: NEGATIVE

## 2018-02-15 SURGERY — ARTHROPLASTY, KNEE, TOTAL
Anesthesia: Regional | Site: Knee | Laterality: Right

## 2018-02-15 MED ORDER — LACTATED RINGERS IV SOLN
INTRAVENOUS | Status: DC
  Administered 2018-02-15 (×2): via INTRAVENOUS

## 2018-02-15 MED ORDER — DEXAMETHASONE SODIUM PHOSPHATE 10 MG/ML IJ SOLN
8.0000 mg | Freq: Once | INTRAMUSCULAR | Status: AC
  Administered 2018-02-15: 8 mg via INTRAVENOUS

## 2018-02-15 MED ORDER — FENTANYL CITRATE (PF) 100 MCG/2ML IJ SOLN: 25.0000 ug | INTRAMUSCULAR | Status: DC | PRN

## 2018-02-15 MED ORDER — PROPOFOL 10 MG/ML IV BOLUS
INTRAVENOUS | Status: AC
  Filled 2018-02-15: qty 40

## 2018-02-15 MED ORDER — EPHEDRINE 5 MG/ML INJ
INTRAVENOUS | Status: AC
  Filled 2018-02-15: qty 10

## 2018-02-15 MED ORDER — DEXAMETHASONE SODIUM PHOSPHATE 10 MG/ML IJ SOLN
10.0000 mg | Freq: Once | INTRAMUSCULAR | Status: AC
  Administered 2018-02-16: 10 mg via INTRAVENOUS
  Filled 2018-02-15: qty 1

## 2018-02-15 MED ORDER — DEXAMETHASONE SODIUM PHOSPHATE 10 MG/ML IJ SOLN
INTRAMUSCULAR | Status: AC
  Filled 2018-02-15: qty 1

## 2018-02-15 MED ORDER — ROPIVACAINE HCL 5 MG/ML IJ SOLN
INTRAMUSCULAR | Status: DC | PRN
  Administered 2018-02-15: 30 mL via PERINEURAL

## 2018-02-15 MED ORDER — OXYCODONE HCL 5 MG PO TABS
5.0000 mg | ORAL_TABLET | ORAL | Status: DC | PRN
  Administered 2018-02-15: 5 mg via ORAL
  Administered 2018-02-17: 10 mg via ORAL
  Filled 2018-02-15 (×4): qty 1

## 2018-02-15 MED ORDER — POLYETHYLENE GLYCOL 3350 17 G PO PACK
17.0000 g | PACK | Freq: Every day | ORAL | Status: DC | PRN
  Administered 2018-02-16 – 2018-02-17 (×2): 17 g via ORAL
  Filled 2018-02-15 (×2): qty 1

## 2018-02-15 MED ORDER — OXYCODONE HCL 5 MG PO TABS
10.0000 mg | ORAL_TABLET | ORAL | Status: DC | PRN
  Administered 2018-02-16: 10 mg via ORAL
  Administered 2018-02-16 (×2): 15 mg via ORAL
  Administered 2018-02-16: 10 mg via ORAL
  Administered 2018-02-17 (×3): 15 mg via ORAL
  Filled 2018-02-15 (×3): qty 3
  Filled 2018-02-15: qty 2
  Filled 2018-02-15 (×3): qty 3

## 2018-02-15 MED ORDER — PHENYLEPHRINE 40 MCG/ML (10ML) SYRINGE FOR IV PUSH (FOR BLOOD PRESSURE SUPPORT)
PREFILLED_SYRINGE | INTRAVENOUS | Status: AC
  Filled 2018-02-15: qty 10

## 2018-02-15 MED ORDER — SODIUM CHLORIDE 0.9 % IV SOLN
INTRAVENOUS | Status: DC
  Administered 2018-02-15 – 2018-02-16 (×2): via INTRAVENOUS

## 2018-02-15 MED ORDER — TRAMADOL HCL 50 MG PO TABS
50.0000 mg | ORAL_TABLET | Freq: Four times a day (QID) | ORAL | Status: DC | PRN
  Administered 2018-02-15: 50 mg via ORAL
  Filled 2018-02-15: qty 1

## 2018-02-15 MED ORDER — METOCLOPRAMIDE HCL 5 MG PO TABS: 5.0000 mg | ORAL_TABLET | Freq: Three times a day (TID) | ORAL | Status: DC | PRN

## 2018-02-15 MED ORDER — FLEET ENEMA 7-19 GM/118ML RE ENEM: 1.0000 | ENEMA | Freq: Once | RECTAL | Status: DC | PRN

## 2018-02-15 MED ORDER — DIPHENHYDRAMINE HCL 12.5 MG/5ML PO ELIX: 12.5000 mg | ORAL_SOLUTION | ORAL | Status: DC | PRN

## 2018-02-15 MED ORDER — PHENYLEPHRINE 40 MCG/ML (10ML) SYRINGE FOR IV PUSH (FOR BLOOD PRESSURE SUPPORT)
PREFILLED_SYRINGE | INTRAVENOUS | Status: DC | PRN
  Administered 2018-02-15: 120 ug via INTRAVENOUS
  Administered 2018-02-15 (×2): 80 ug via INTRAVENOUS

## 2018-02-15 MED ORDER — ACETAMINOPHEN 500 MG PO TABS
1000.0000 mg | ORAL_TABLET | Freq: Four times a day (QID) | ORAL | Status: AC
  Administered 2018-02-15 – 2018-02-16 (×4): 1000 mg via ORAL
  Filled 2018-02-15 (×4): qty 2

## 2018-02-15 MED ORDER — ONDANSETRON HCL 4 MG/2ML IJ SOLN
INTRAMUSCULAR | Status: AC
  Filled 2018-02-15: qty 2

## 2018-02-15 MED ORDER — HYDROMORPHONE HCL 1 MG/ML IJ SOLN
0.5000 mg | INTRAMUSCULAR | Status: DC | PRN
  Administered 2018-02-15: 0.5 mg via INTRAVENOUS
  Administered 2018-02-16: 1 mg via INTRAVENOUS
  Filled 2018-02-15 (×2): qty 1

## 2018-02-15 MED ORDER — CHLORHEXIDINE GLUCONATE 4 % EX LIQD: 60.0000 mL | Freq: Once | CUTANEOUS | Status: DC

## 2018-02-15 MED ORDER — PROPOFOL 10 MG/ML IV BOLUS
INTRAVENOUS | Status: AC
  Filled 2018-02-15: qty 20

## 2018-02-15 MED ORDER — SODIUM CHLORIDE 0.9 % IJ SOLN
INTRAMUSCULAR | Status: AC
  Filled 2018-02-15: qty 50

## 2018-02-15 MED ORDER — ONDANSETRON HCL 4 MG/2ML IJ SOLN
4.0000 mg | Freq: Four times a day (QID) | INTRAMUSCULAR | Status: DC | PRN
  Administered 2018-02-15: 4 mg via INTRAVENOUS
  Filled 2018-02-15: qty 2

## 2018-02-15 MED ORDER — PHENYLEPHRINE HCL 10 MG/ML IJ SOLN
INTRAMUSCULAR | Status: AC
  Filled 2018-02-15: qty 1

## 2018-02-15 MED ORDER — BUPIVACAINE LIPOSOME 1.3 % IJ SUSP
INTRAMUSCULAR | Status: DC | PRN
  Administered 2018-02-15: 20 mL

## 2018-02-15 MED ORDER — ASPIRIN EC 325 MG PO TBEC
325.0000 mg | DELAYED_RELEASE_TABLET | Freq: Two times a day (BID) | ORAL | Status: DC
  Administered 2018-02-16 – 2018-02-17 (×3): 325 mg via ORAL
  Filled 2018-02-15 (×3): qty 1

## 2018-02-15 MED ORDER — MIDAZOLAM HCL 2 MG/2ML IJ SOLN: 1.0000 mg | INTRAMUSCULAR | Status: DC

## 2018-02-15 MED ORDER — METHOCARBAMOL 1000 MG/10ML IJ SOLN
500.0000 mg | Freq: Four times a day (QID) | INTRAVENOUS | Status: DC | PRN
  Filled 2018-02-15: qty 5

## 2018-02-15 MED ORDER — GABAPENTIN 300 MG PO CAPS
300.0000 mg | ORAL_CAPSULE | Freq: Three times a day (TID) | ORAL | Status: DC
  Administered 2018-02-15 – 2018-02-17 (×7): 300 mg via ORAL
  Filled 2018-02-15 (×7): qty 1

## 2018-02-15 MED ORDER — SODIUM CHLORIDE 0.9 % IJ SOLN
INTRAMUSCULAR | Status: AC
  Filled 2018-02-15: qty 10

## 2018-02-15 MED ORDER — CHLORTHALIDONE 25 MG PO TABS
25.0000 mg | ORAL_TABLET | Freq: Every day | ORAL | Status: DC
  Administered 2018-02-16 – 2018-02-17 (×2): 25 mg via ORAL
  Filled 2018-02-15 (×2): qty 1

## 2018-02-15 MED ORDER — DOCUSATE SODIUM 100 MG PO CAPS
100.0000 mg | ORAL_CAPSULE | Freq: Two times a day (BID) | ORAL | Status: DC
  Administered 2018-02-15 – 2018-02-17 (×4): 100 mg via ORAL
  Filled 2018-02-15 (×4): qty 1

## 2018-02-15 MED ORDER — FENTANYL CITRATE (PF) 100 MCG/2ML IJ SOLN
INTRAMUSCULAR | Status: AC
  Filled 2018-02-15: qty 2

## 2018-02-15 MED ORDER — DEXTROSE 5 % IV SOLN
INTRAVENOUS | Status: DC | PRN
  Administered 2018-02-15: 20 ug/min via INTRAVENOUS

## 2018-02-15 MED ORDER — BUPIVACAINE IN DEXTROSE 0.75-8.25 % IT SOLN
INTRATHECAL | Status: DC | PRN
  Administered 2018-02-15: 15 mg via INTRATHECAL

## 2018-02-15 MED ORDER — METOCLOPRAMIDE HCL 5 MG/ML IJ SOLN
5.0000 mg | Freq: Three times a day (TID) | INTRAMUSCULAR | Status: DC | PRN
  Administered 2018-02-15: 5 mg via INTRAVENOUS
  Filled 2018-02-15: qty 2

## 2018-02-15 MED ORDER — ACETAMINOPHEN 10 MG/ML IV SOLN
1000.0000 mg | Freq: Four times a day (QID) | INTRAVENOUS | Status: DC
  Administered 2018-02-15: 1000 mg via INTRAVENOUS
  Filled 2018-02-15: qty 100

## 2018-02-15 MED ORDER — ONDANSETRON HCL 4 MG/2ML IJ SOLN: 4.0000 mg | Freq: Once | INTRAMUSCULAR | Status: DC | PRN

## 2018-02-15 MED ORDER — TRANEXAMIC ACID 1000 MG/10ML IV SOLN
1000.0000 mg | Freq: Once | INTRAVENOUS | Status: AC
  Administered 2018-02-15: 1000 mg via INTRAVENOUS
  Filled 2018-02-15: qty 1100

## 2018-02-15 MED ORDER — SODIUM CHLORIDE 0.9 % IR SOLN
Status: DC | PRN
  Administered 2018-02-15: 1000 mL

## 2018-02-15 MED ORDER — ACETAMINOPHEN 325 MG PO TABS
325.0000 mg | ORAL_TABLET | Freq: Four times a day (QID) | ORAL | Status: DC | PRN
  Administered 2018-02-17: 650 mg via ORAL
  Filled 2018-02-15: qty 2

## 2018-02-15 MED ORDER — CEFAZOLIN SODIUM-DEXTROSE 2-4 GM/100ML-% IV SOLN
2.0000 g | Freq: Four times a day (QID) | INTRAVENOUS | Status: AC
  Administered 2018-02-15 (×2): 2 g via INTRAVENOUS
  Filled 2018-02-15 (×2): qty 100

## 2018-02-15 MED ORDER — BISACODYL 10 MG RE SUPP: 10.0000 mg | Freq: Every day | RECTAL | Status: DC | PRN

## 2018-02-15 MED ORDER — PHENOL 1.4 % MT LIQD: 1.0000 | OROMUCOSAL | Status: DC | PRN

## 2018-02-15 MED ORDER — SODIUM CHLORIDE 0.9 % IJ SOLN
INTRAMUSCULAR | Status: DC | PRN
  Administered 2018-02-15: 60 mL

## 2018-02-15 MED ORDER — METHOCARBAMOL 500 MG PO TABS
500.0000 mg | ORAL_TABLET | Freq: Four times a day (QID) | ORAL | Status: DC | PRN
  Administered 2018-02-15 – 2018-02-17 (×6): 500 mg via ORAL
  Filled 2018-02-15 (×6): qty 1

## 2018-02-15 MED ORDER — ONDANSETRON HCL 4 MG PO TABS
4.0000 mg | ORAL_TABLET | Freq: Four times a day (QID) | ORAL | Status: DC | PRN
  Administered 2018-02-17: 4 mg via ORAL
  Filled 2018-02-15: qty 1

## 2018-02-15 MED ORDER — TRAMADOL HCL 50 MG PO TABS: 50.0000 mg | ORAL_TABLET | Freq: Four times a day (QID) | ORAL | Status: DC | PRN

## 2018-02-15 MED ORDER — MENTHOL 3 MG MT LOZG: 1.0000 | LOZENGE | OROMUCOSAL | Status: DC | PRN

## 2018-02-15 MED ORDER — MIDAZOLAM HCL 2 MG/2ML IJ SOLN
INTRAMUSCULAR | Status: AC
  Administered 2018-02-15: 1 mg
  Filled 2018-02-15: qty 2

## 2018-02-15 MED ORDER — ONDANSETRON HCL 4 MG/2ML IJ SOLN
INTRAMUSCULAR | Status: DC | PRN
  Administered 2018-02-15: 4 mg via INTRAVENOUS

## 2018-02-15 MED ORDER — CEFAZOLIN SODIUM-DEXTROSE 2-4 GM/100ML-% IV SOLN
2.0000 g | INTRAVENOUS | Status: AC
  Administered 2018-02-15: 2 g via INTRAVENOUS
  Filled 2018-02-15: qty 100

## 2018-02-15 MED ORDER — PROPOFOL 500 MG/50ML IV EMUL
INTRAVENOUS | Status: DC | PRN
  Administered 2018-02-15: 100 ug/kg/min via INTRAVENOUS

## 2018-02-15 SURGICAL SUPPLY — 51 items
BAG ZIPLOCK 12X15 (MISCELLANEOUS) ×3 IMPLANT
BANDAGE ACE 6X5 VEL STRL LF (GAUZE/BANDAGES/DRESSINGS) ×3 IMPLANT
BLADE SAG 18X100X1.27 (BLADE) ×3 IMPLANT
BLADE SAW SGTL 11.0X1.19X90.0M (BLADE) ×3 IMPLANT
BOWL SMART MIX CTS (DISPOSABLE) ×3 IMPLANT
CAPT KNEE TOTAL 3 ATTUNE ×3 IMPLANT
CEMENT HV SMART SET (Cement) ×6 IMPLANT
CLOSURE WOUND 1/2 X4 (GAUZE/BANDAGES/DRESSINGS) ×1
COVER SURGICAL LIGHT HANDLE (MISCELLANEOUS) ×3 IMPLANT
CUFF TOURN SGL QUICK 34 (TOURNIQUET CUFF) ×2
CUFF TRNQT CYL 34X4X40X1 (TOURNIQUET CUFF) ×1 IMPLANT
DECANTER SPIKE VIAL GLASS SM (MISCELLANEOUS) ×3 IMPLANT
DRAPE U-SHAPE 47X51 STRL (DRAPES) ×3 IMPLANT
DRSG ADAPTIC 3X8 NADH LF (GAUZE/BANDAGES/DRESSINGS) ×3 IMPLANT
DRSG PAD ABDOMINAL 8X10 ST (GAUZE/BANDAGES/DRESSINGS) ×3 IMPLANT
DURAPREP 26ML APPLICATOR (WOUND CARE) ×3 IMPLANT
ELECT REM PT RETURN 15FT ADLT (MISCELLANEOUS) ×3 IMPLANT
EVACUATOR 1/8 PVC DRAIN (DRAIN) ×3 IMPLANT
GAUZE SPONGE 4X4 12PLY STRL (GAUZE/BANDAGES/DRESSINGS) ×3 IMPLANT
GLOVE BIO SURGEON STRL SZ 6.5 (GLOVE) ×2 IMPLANT
GLOVE BIO SURGEON STRL SZ8 (GLOVE) ×6 IMPLANT
GLOVE BIO SURGEONS STRL SZ 6.5 (GLOVE) ×1
GLOVE BIOGEL PI IND STRL 6.5 (GLOVE) ×1 IMPLANT
GLOVE BIOGEL PI IND STRL 7.0 (GLOVE) ×2 IMPLANT
GLOVE BIOGEL PI IND STRL 8 (GLOVE) ×1 IMPLANT
GLOVE BIOGEL PI INDICATOR 6.5 (GLOVE) ×2
GLOVE BIOGEL PI INDICATOR 7.0 (GLOVE) ×4
GLOVE BIOGEL PI INDICATOR 8 (GLOVE) ×2
GLOVE SURG SS PI 6.5 STRL IVOR (GLOVE) ×3 IMPLANT
GOWN STRL REUS W/TWL LRG LVL3 (GOWN DISPOSABLE) ×6 IMPLANT
HANDPIECE INTERPULSE COAX TIP (DISPOSABLE) ×2
HOLDER FOLEY CATH W/STRAP (MISCELLANEOUS) IMPLANT
IMMOBILIZER KNEE 20 (SOFTGOODS) ×3
IMMOBILIZER KNEE 20 THIGH 36 (SOFTGOODS) ×1 IMPLANT
MANIFOLD NEPTUNE II (INSTRUMENTS) ×3 IMPLANT
NS IRRIG 1000ML POUR BTL (IV SOLUTION) ×3 IMPLANT
PACK TOTAL KNEE CUSTOM (KITS) ×3 IMPLANT
PAD ABD 8X10 STRL (GAUZE/BANDAGES/DRESSINGS) ×3 IMPLANT
PADDING CAST COTTON 6X4 STRL (CAST SUPPLIES) ×6 IMPLANT
POSITIONER SURGICAL ARM (MISCELLANEOUS) ×3 IMPLANT
SET HNDPC FAN SPRY TIP SCT (DISPOSABLE) ×1 IMPLANT
STRIP CLOSURE SKIN 1/2X4 (GAUZE/BANDAGES/DRESSINGS) ×2 IMPLANT
SUT MNCRL AB 4-0 PS2 18 (SUTURE) ×3 IMPLANT
SUT STRATAFIX 0 PDS 27 VIOLET (SUTURE) ×3
SUT VIC AB 2-0 CT1 27 (SUTURE) ×8
SUT VIC AB 2-0 CT1 TAPERPNT 27 (SUTURE) ×4 IMPLANT
SUTURE STRATFX 0 PDS 27 VIOLET (SUTURE) ×1 IMPLANT
TRAY FOLEY MTR SLVR 16FR STAT (SET/KITS/TRAYS/PACK) ×3 IMPLANT
WATER STERILE IRR 1000ML POUR (IV SOLUTION) ×6 IMPLANT
WRAP KNEE MAXI GEL POST OP (GAUZE/BANDAGES/DRESSINGS) ×3 IMPLANT
YANKAUER SUCT BULB TIP 10FT TU (MISCELLANEOUS) ×3 IMPLANT

## 2018-02-15 NOTE — Op Note (Signed)
OPERATIVE REPORT-TOTAL KNEE ARTHROPLASTY   Pre-operative diagnosis- Osteoarthritis  Right knee(s)  Post-operative diagnosis- Osteoarthritis Right knee(s)  Procedure-  Right  Total Knee Arthroplasty (Depuy Attune)  Surgeon- Peggy RankinFrank V. Vennela Jutte, MD  Assistant- Leilani AbleSteve Chabon, PA-C   Anesthesia-  Adductor canal block and spinal  EBL-30 mL   Drains Hemovac  Tourniquet time-  Total Tourniquet Time Documented: Thigh (Right) - 41 minutes Total: Thigh (Right) - 41 minutes     Complications- None  Condition-PACU - hemodynamically stable.   Brief Clinical Note  Peggy Harrell is a 67 y.o. year old female with end stage OA of her right knee with progressively worsening pain and dysfunction. She has constant pain, with activity and at rest and significant functional deficits with difficulties even with ADLs. She has had extensive non-op management including analgesics, injections of cortisone, and home exercise program, but remains in significant pain with significant dysfunction.Radiographs show bone on bone arthritis medial and patellofemoral. She presents now for right Total Knee Arthroplasty.    Procedure in detail---   The patient is brought into the operating room and positioned supine on the operating table. After successful administration of  Adductor canal block and spinal,   a tourniquet is placed high on the  Right thigh(s) and the lower extremity is prepped and draped in the usual sterile fashion. Time out is performed by the operating team and then the  Right lower extremity is wrapped in Esmarch, knee flexed and the tourniquet inflated to 300 mmHg.       A midline incision is made with a ten blade through the subcutaneous tissue to the level of the extensor mechanism. A fresh blade is used to make a medial parapatellar arthrotomy. Soft tissue over the proximal medial tibia is subperiosteally elevated to the joint line with a knife and into the semimembranosus bursa with a Cobb  elevator. Soft tissue over the proximal lateral tibia is elevated with attention being paid to avoiding the patellar tendon on the tibial tubercle. The patella is everted, knee flexed 90 degrees and the ACL and PCL are removed. Findings are bone on bone all 3 compartments with massive global osteophytes.        The drill is used to create a starting hole in the distal femur and the canal is thoroughly irrigated with sterile saline to remove the fatty contents. The 5 degree Right  valgus alignment guide is placed into the femoral canal and the distal femoral cutting block is pinned to remove 9 mm off the distal femur. Resection is made with an oscillating saw.      The tibia is subluxed forward and the menisci are removed. The extramedullary alignment guide is placed referencing proximally at the medial aspect of the tibial tubercle and distally along the second metatarsal axis and tibial crest. The block is pinned to remove 2mm off the more deficient medial  side. Resection is made with an oscillating saw. Size 5is the most appropriate size for the tibia and the proximal tibia is prepared with the modular drill and keel punch for that size.      The femoral sizing guide is placed and size 6 is most appropriate. Rotation is marked off the epicondylar axis and confirmed by creating a rectangular flexion gap at 90 degrees. The size 6 cutting block is pinned in this rotation and the anterior, posterior and chamfer cuts are made with the oscillating saw. The intercondylar block is then placed and that cut is made.  Trial size 5 tibial component, trial size 6 posterior stabilized femur and a 12  mm posterior stabilized rotating platform insert trial is placed. Full extension is achieved with excellent varus/valgus and anterior/posterior balance throughout full range of motion. The patella is everted and thickness measured to be 22  mm. Free hand resection is taken to 12 mm, a 38 template is placed, lug holes are  drilled, trial patella is placed, and it tracks normally. Osteophytes are removed off the posterior femur with the trial in place. All trials are removed and the cut bone surfaces prepared with pulsatile lavage. Cement is mixed and once ready for implantation, the size 5 tibial implant, size  6 posterior stabilized femoral component, and the size 38 patella are cemented in place and the patella is held with the clamp. The trial insert is placed and the knee held in full extension. The Exparel (20 ml mixed with 60 ml saline) is injected into the extensor mechanism, posterior capsule, medial and lateral gutters and subcutaneous tissues.  All extruded cement is removed and once the cement is hard the permanent 12 mm posterior stabilized rotating platform insert is placed into the tibial tray.      The wound is copiously irrigated with saline solution and the extensor mechanism closed over a hemovac drain with #1 V-loc suture. The tourniquet is released for a total tourniquet time of 41  minutes. Flexion against gravity is 140 degrees and the patella tracks normally. Subcutaneous tissue is closed with 2.0 vicryl and subcuticular with running 4.0 Monocryl. The incision is cleaned and dried and steri-strips and a bulky sterile dressing are applied. The limb is placed into a knee immobilizer and the patient is awakened and transported to recovery in stable condition.      Please note that a surgical assistant was a medical necessity for this procedure in order to perform it in a safe and expeditious manner. Surgical assistant was necessary to retract the ligaments and vital neurovascular structures to prevent injury to them and also necessary for proper positioning of the limb to allow for anatomic placement of the prosthesis.   Peggy Harrell Peggy Coles, MD    02/15/2018, 11:11 AM

## 2018-02-15 NOTE — Discharge Instructions (Signed)
° °Dr. Frank Aluisio °Total Joint Specialist °[facility] °[address] °[place], [place] [address] °[phone] ° °TOTAL KNEE REPLACEMENT POSTOPERATIVE DIRECTIONS ° °Knee Rehabilitation, Guidelines Following Surgery  °Results after knee surgery are often greatly improved when you follow the exercise, range of motion and muscle strengthening exercises prescribed by your doctor. Safety measures are also important to protect the knee from further injury. Any time any of these exercises cause you to have increased pain or swelling in your knee joint, decrease the amount until you are comfortable again and slowly increase them. If you have problems or questions, call your caregiver or physical therapist for advice.  ° °HOME CARE INSTRUCTIONS  °Remove items at home which could result in a fall. This includes throw rugs or furniture in walking pathways.  °· ICE to the affected knee every three hours for 30 minutes at a time and then as needed for pain and swelling.  Continue to use ice on the knee for pain and swelling from surgery. You may notice swelling that will progress down to the foot and ankle.  This is normal after surgery.  Elevate the leg when you are not up walking on it.   °· Continue to use the breathing machine which will help keep your temperature down.  It is common for your temperature to cycle up and down following surgery, especially at night when you are not up moving around and exerting yourself.  The breathing machine keeps your lungs expanded and your temperature down. °· Do not place pillow under knee, focus on keeping the knee straight while resting ° °DIET °You may resume your previous home diet once your are discharged from the hospital. ° °DRESSING / WOUND CARE / SHOWERING °You may change your dressing every day with sterile gauze.  Please use good hand washing techniques before changing the dressing.  Do not use any lotions or creams on the incision until instructed by your  surgeon. °You may start showering once you are discharged home but do not submerge the incision under water. Just pat the incision dry and apply a dry gauze dressing on daily. °Change the surgical dressing daily and reapply a dry dressing each time. ° °ACTIVITY °Walk with your walker as instructed. °Use walker as long as suggested by your caregivers. °Avoid periods of inactivity such as sitting longer than an hour when not asleep. This helps prevent blood clots.  °You may resume a sexual relationship in one month or when given the OK by your doctor.  °You may return to work once you are cleared by your doctor.  °Do not drive a car for 6 weeks or until released by you surgeon.  °Do not drive while taking narcotics. ° °WEIGHT BEARING °Weight bearing as tolerated with assist device (walker, cane, etc) as directed, use it as long as suggested by your surgeon or therapist, typically at least 4-6 weeks. ° °POSTOPERATIVE CONSTIPATION PROTOCOL °Constipation - defined medically as fewer than three stools per week and severe constipation as less than one stool per week. ° °One of the most common issues patients have following surgery is constipation.  Even if you have a regular bowel pattern at home, your normal regimen is likely to be disrupted due to multiple reasons following surgery.  Combination of anesthesia, postoperative narcotics, change in appetite and fluid intake all can affect your bowels.  In order to avoid complications following surgery, here are some recommendations in order to help you during your recovery period. ° °  Colace (docusate) - Pick up an over-the-counter form of Colace or another stool softener and take twice a day as long as you are requiring postoperative pain medications.  Take with a full glass of water daily.  If you experience loose stools or diarrhea, hold the colace until you stool forms back up.  If your symptoms do not get better within 1 week or if they get worse, check with your  doctor. ° °Dulcolax (bisacodyl) - Pick up over-the-counter and take as directed by the product packaging as needed to assist with the movement of your bowels.  Take with a full glass of water.  Use this product as needed if not relieved by Colace only.  ° °MiraLax (polyethylene glycol) - Pick up over-the-counter to have on hand.  MiraLax is a solution that will increase the amount of water in your bowels to assist with bowel movements.  Take as directed and can mix with a glass of water, juice, soda, coffee, or tea.  Take if you go more than two days without a movement. °Do not use MiraLax more than once per day. Call your doctor if you are still constipated or irregular after using this medication for 7 days in a row. ° °If you continue to have problems with postoperative constipation, please contact the office for further assistance and recommendations.  If you experience "the worst abdominal pain ever" or develop nausea or vomiting, please contact the office immediatly for further recommendations for treatment. ° °ITCHING ° If you experience itching with your medications, try taking only a single pain pill, or even half a pain pill at a time.  You can also use Benadryl over the counter for itching or also to help with sleep.  ° °TED HOSE STOCKINGS °Wear the elastic stockings on both legs for three weeks following surgery during the day but you may remove then at night for sleeping. ° °MEDICATIONS °See your medication summary on the “After Visit Summary” that the nursing staff will review with you prior to discharge.  You may have some home medications which will be placed on hold until you complete the course of blood thinner medication.  It is important for you to complete the blood thinner medication as prescribed by your surgeon.  Continue your approved medications as instructed at time of discharge. ° °PRECAUTIONS °If you experience chest pain or shortness of breath - call 911 immediately for transfer to the  hospital emergency department.  °If you develop a fever greater that 101 F, purulent drainage from wound, increased redness or drainage from wound, foul odor from the wound/dressing, or calf pain - CONTACT YOUR SURGEON.   °                                                °FOLLOW-UP APPOINTMENTS °Make sure you keep all of your appointments after your operation with your surgeon and caregivers. You should call the office at the above phone number and make an appointment for approximately two weeks after the date of your surgery or on the date instructed by your surgeon outlined in the "After Visit Summary". ° ° °RANGE OF MOTION AND STRENGTHENING EXERCISES  °Rehabilitation of the knee is important following a knee injury or an operation. After just a few days of immobilization, the muscles of the thigh which control the knee become weakened and   shrink (atrophy). Knee exercises are designed to build up the tone and strength of the thigh muscles and to improve knee motion. Often times heat used for twenty to thirty minutes before working out will loosen up your tissues and help with improving the range of motion but do not use heat for the first two weeks following surgery. These exercises can be done on a training (exercise) mat, on the floor, on a table or on a bed. Use what ever works the best and is most comfortable for you Knee exercises include:  °Leg Lifts - While your knee is still immobilized in a splint or cast, you can do straight leg raises. Lift the leg to 60 degrees, hold for 3 sec, and slowly lower the leg. Repeat 10-20 times 2-3 times daily. Perform this exercise against resistance later as your knee gets better.  °Quad and Hamstring Sets - Tighten up the muscle on the front of the thigh (Quad) and hold for 5-10 sec. Repeat this 10-20 times hourly. Hamstring sets are done by pushing the foot backward against an object and holding for 5-10 sec. Repeat as with quad sets.  °· Leg Slides: Lying on your back,  slowly slide your foot toward your buttocks, bending your knee up off the floor (only go as far as is comfortable). Then slowly slide your foot back down until your leg is flat on the floor again. °· Angel Wings: Lying on your back spread your legs to the side as far apart as you can without causing discomfort.  °A rehabilitation program following serious knee injuries can speed recovery and prevent re-injury in the future due to weakened muscles. Contact your doctor or a physical therapist for more information on knee rehabilitation.  ° °IF YOU ARE TRANSFERRED TO A SKILLED REHAB FACILITY °If the patient is transferred to a skilled rehab facility following release from the hospital, a list of the current medications will be sent to the facility for the patient to continue.  When discharged from the skilled rehab facility, please have the facility set up the patient's Home Health Physical Therapy prior to being released. Also, the skilled facility will be responsible for providing the patient with their medications at time of release from the facility to include their pain medication, the muscle relaxants, and their blood thinner medication. If the patient is still at the rehab facility at time of the two week follow up appointment, the skilled rehab facility will also need to assist the patient in arranging follow up appointment in our office and any transportation needs. ° °MAKE SURE YOU:  °Understand these instructions.  °Get help right away if you are not doing well or get worse.  ° ° °Pick up stool softner and laxative for home use following surgery while on pain medications. °Do not submerge incision under water. °Please use good hand washing techniques while changing dressing each day. °May shower starting three days after surgery. °Please use a clean towel to pat the incision dry following showers. °Continue to use ice for pain and swelling after surgery. °Do not use any lotions or creams on the incision  until instructed by your surgeon. °

## 2018-02-15 NOTE — Interval H&P Note (Signed)
History and Physical Interval Note:  02/15/2018 6:53 AM  Peggy Harrell  has presented today for surgery, with the diagnosis of Osteoarthritis Right knee  The various methods of treatment have been discussed with the patient and family. After consideration of risks, benefits and other options for treatment, the patient has consented to  Procedure(s): RIGHT TOTAL KNEE ARTHROPLASTY (Right) as a surgical intervention .  The patient's history has been reviewed, patient examined, no change in status, stable for surgery.  I have reviewed the patient's chart and labs.  Questions were answered to the patient's satisfaction.     Homero FellersFrank Inocencia Murtaugh

## 2018-02-15 NOTE — Evaluation (Signed)
Physical Therapy Evaluation Patient Details Name: Peggy Harrell MRN: [id number] DOB: Mar 24, 1951 Today's Date: 02/15/2018   History of Present Illness  R TKA on 02/15/18  Clinical Impression  The patient experiencing nausea prior to ambulating x 8'. Emesis after  Sitting down, some dizziness. BP  135/78.  Pt admitted with above diagnosis. Pt currently with functional limitations due to the deficits listed below (see PT Problem List).  Pt will benefit from skilled PT to increase their independence and safety with mobility to allow discharge to the venue listed below.       Follow Up Recommendations Follow surgeon's recommendation for DC plan and follow-up therapies;Outpatient PT    Equipment Recommendations  None recommended by PT    Recommendations for Other Services       Precautions / Restrictions Precautions Precautions: Knee;Fall Precaution Comments: dizzy, N/V      Mobility  Bed Mobility Overal bed mobility: Needs Assistance Bed Mobility: Supine to Sit     Supine to sit: Min assist     General bed mobility comments: support right leg  Transfers Overall transfer level: Needs assistance Equipment used: Rolling walker (2 wheeled) Transfers: Sit to/from Stand Sit to Stand: Mod assist;+2 physical assistance;+2 safety/equipment;From elevated surface         General transfer comment: steady assist to rise from raised bed. Cues for hand and right l;eg position  Ambulation/Gait Ambulation/Gait assistance: Mod assist;+2 physical assistance;+2 safety/equipment Gait Distance (Feet): 8 Feet Assistive device: Rolling walker (2 wheeled) Gait Pattern/deviations: Step-to pattern;Antalgic;Decreased step length - right;Decreased stance time - right     General Gait Details: cues for sequence  Stairs            Wheelchair Mobility    Modified Rankin (Stroke Patients Only)       Balance                                              Pertinent Vitals/Pain Pain Assessment: 0-10 Pain Score: 6  Pain Descriptors / Indicators: Cramping;Discomfort;Crying;Grimacing;Guarding;Aching Pain Intervention(s): Limited activity within patient's tolerance;Monitored during session;Premedicated before session;Patient requesting pain meds-RN notified;Repositioned;Ice applied    Home Living Family/patient expects to be discharged to:: Private residence Living Arrangements: Children Available Help at Discharge: Family Type of Home: House Home Access: Stairs to enter   Secretary/administrator of Steps: 1 + 2 Home Layout: Two level;Bed/bath upstairs Home Equipment: Walker - 2 wheels Additional Comments: son    Prior Function Level of Independence: Independent with assistive device(s)               Hand Dominance        Extremity/Trunk Assessment   Upper Extremity Assessment Upper Extremity Assessment: Defer to OT evaluation    Lower Extremity Assessment Lower Extremity Assessment: LLE deficits/detail;RLE deficits/detail RLE Deficits / Details: raised the leg from  the bed, reports some numbness in forefoot from bunion LLE Deficits / Details: reports numbness in forefoot from bunion    Cervical / Trunk Assessment Cervical / Trunk Assessment: Normal  Communication   Communication: No difficulties  Cognition Arousal/Alertness: Awake/alert Behavior During Therapy: WFL for tasks assessed/performed Overall Cognitive Status: Within Functional Limits for tasks assessed  General Comments      Exercises     Assessment/Plan    PT Assessment Patient needs continued PT services  PT Problem List Decreased strength;Decreased range of motion;Decreased knowledge of use of DME;Decreased activity tolerance;Decreased safety awareness;Decreased knowledge of precautions;Decreased mobility;Pain       PT Treatment Interventions DME instruction;Gait training;Stair  training;Functional mobility training;Therapeutic activities;Patient/family education    PT Goals (Current goals can be found in the Care Plan section)  Acute Rehab PT Goals Patient Stated Goal: to walk without pain PT Goal Formulation: With patient/family Time For Goal Achievement: 02/19/18 Potential to Achieve Goals: Good    Frequency 7X/week   Barriers to discharge        Co-evaluation               AM-PAC PT "6 Clicks" Daily Activity  Outcome Measure Difficulty turning over in bed (including adjusting bedclothes, sheets and blankets)?: A Lot Difficulty moving from lying on back to sitting on the side of the bed? : A Lot Difficulty sitting down on and standing up from a chair with arms (e.g., wheelchair, bedside commode, etc,.)?: A Lot Help needed moving to and from a bed to chair (including a wheelchair)?: A Lot Help needed walking in hospital room?: Total Help needed climbing 3-5 steps with a railing? : Total 6 Click Score: 10    End of Session Equipment Utilized During Treatment: Gait belt;Right knee immobilizer Activity Tolerance: Treatment limited secondary to medical complications (Comment)(nausea and dizziness) Patient left: in chair;with call bell/phone within reach;with family/visitor present;with nursing/sitter in room Nurse Communication: Mobility status;Patient requests pain meds(N/V) PT Visit Diagnosis: Unsteadiness on feet (R26.81);Pain Pain - Right/Left: Right Pain - part of body: Knee    Time: 1701-1735 PT Time Calculation (min) (ACUTE ONLY): 34 min   Charges:   PT Evaluation $PT Eval Low Complexity: 1 Low PT Treatments $Gait Training: 8-22 mins   PT G CodesBlanchard Kelch:       Dryden Tapley PT [phone]   Rada HayHill, Lavone Barrientes Elizabeth 02/15/2018, 5:59 PM

## 2018-02-15 NOTE — Anesthesia Preprocedure Evaluation (Addendum)
Anesthesia Evaluation  Patient identified by MRN, date of birth, ID band Patient awake    Reviewed: Allergy & Precautions, NPO status , Patient's Chart, lab work & pertinent test results  History of Anesthesia Complications (+) PONV and history of anesthetic complications  Airway Mallampati: II  TM Distance: >3 FB Neck ROM: Full    Dental no notable dental hx.    Pulmonary former smoker,    Pulmonary exam normal breath sounds clear to auscultation       Cardiovascular hypertension, Pt. on medications Normal cardiovascular exam Rhythm:Regular Rate:Normal  ECG: NSR, rate 70   Neuro/Psych negative neurological ROS  negative psych ROS   GI/Hepatic negative GI ROS, Neg liver ROS,   Endo/Other  negative endocrine ROS  Renal/GU negative Renal ROS     Musculoskeletal  (+) Arthritis , Osteoarthritis,    Abdominal (+) + obese,   Peds  Hematology  (+) anemia ,   Anesthesia Other Findings   Reproductive/Obstetrics                            Anesthesia Physical Anesthesia Plan  ASA: II  Anesthesia Plan: Spinal and Regional   Post-op Pain Management:  Regional for Post-op pain   Induction: Intravenous  PONV Risk Score and Plan: 3 and Propofol infusion and Treatment may vary due to age or medical condition  Airway Management Planned: Natural Airway  Additional Equipment:   Intra-op Plan:   Post-operative Plan:   Informed Consent: I have reviewed the patients History and Physical, chart, labs and discussed the procedure including the risks, benefits and alternatives for the proposed anesthesia with the patient or authorized representative who has indicated his/her understanding and acceptance.   Dental advisory given  Plan Discussed with: CRNA  Anesthesia Plan Comments:         Anesthesia Quick Evaluation

## 2018-02-15 NOTE — Transfer of Care (Signed)
Immediate Anesthesia Transfer of Care Note  Patient: Peggy Harrell  Procedure(s) Performed: RIGHT TOTAL KNEE ARTHROPLASTY (Right Knee)  Patient Location: PACU  Anesthesia Type:Spinal and MAC combined with regional for post-op pain  Level of Consciousness: awake, alert , oriented and patient cooperative  Airway & Oxygen Therapy: Patient Spontanous Breathing and Patient connected to face mask oxygen  Post-op Assessment: Report given to RN and Post -op Vital signs reviewed and stable  Post vital signs: Reviewed and stable  Last Vitals:  Vitals Value Taken Time  BP 99/59 02/15/2018 11:36 AM  Temp    Pulse 73 02/15/2018 11:39 AM  Resp 16 02/15/2018 11:39 AM  SpO2 99 % 02/15/2018 11:39 AM  Vitals shown include unvalidated device data.  Last Pain:  Vitals:   02/15/18 0929  TempSrc:   PainSc: 0-No pain         Complications: No apparent anesthesia complications

## 2018-02-15 NOTE — Progress Notes (Signed)
AssistedDr. Ellender with right, ultrasound guided, adductor canal block. Side rails up, monitors on throughout procedure. See vital signs in flow sheet. Tolerated Procedure well.  

## 2018-02-15 NOTE — Anesthesia Procedure Notes (Signed)
Spinal  Patient location during procedure: OR Start time: 02/15/2018 9:57 AM End time: 02/15/2018 10:00 AM Staffing Resident/CRNA: Lind Covert, CRNA Performed: resident/CRNA  Preanesthetic Checklist Completed: patient identified, site marked, surgical consent, pre-op evaluation, timeout performed, IV checked, risks and benefits discussed and monitors and equipment checked Spinal Block Patient position: sitting Prep: DuraPrep Patient monitoring: heart rate, cardiac monitor, continuous pulse ox and blood pressure Approach: midline Location: L3-4 Injection technique: single-shot Needle Needle type: Pencan  Needle gauge: 24 G Needle length: 10 cm Needle insertion depth: 7 cm Assessment Sensory level: T6 Additional Notes Timeout performed. SAB kit date checked. SAB without difficullty

## 2018-02-15 NOTE — Anesthesia Postprocedure Evaluation (Signed)
Anesthesia Post Note  Patient: Peggy Harrell  Procedure(s) Performed: RIGHT TOTAL KNEE ARTHROPLASTY (Right Knee)     Patient location during evaluation: PACU Anesthesia Type: Regional and Spinal Level of consciousness: oriented and awake and alert Pain management: pain level controlled Vital Signs Assessment: post-procedure vital signs reviewed and stable Respiratory status: spontaneous breathing, respiratory function stable and patient connected to nasal cannula oxygen Cardiovascular status: blood pressure returned to baseline and stable Postop Assessment: no headache, no backache and no apparent nausea or vomiting Anesthetic complications: no    Last Vitals:  Vitals:   02/15/18 1435 02/15/18 1525  BP: 139/71 128/75  Pulse: 75 61  Resp: 17 14  Temp: 36.5 C 36.6 C  SpO2: 100% 100%    Last Pain:  Vitals:   02/15/18 1531  TempSrc:   PainSc: 2                  Dermot Gremillion P Raymont Andreoni

## 2018-02-15 NOTE — Plan of Care (Signed)
Plan of care discussed with patient 

## 2018-02-15 NOTE — Anesthesia Procedure Notes (Signed)
Anesthesia Regional Block: Adductor canal block   Pre-Anesthetic Checklist: ,, timeout performed, Correct Patient, Correct Site, Correct Laterality, Correct Procedure,, site marked, risks and benefits discussed, Surgical consent,  Pre-op evaluation,  At surgeon's request and post-op pain management  Laterality: Right  Prep: chloraprep       Needles:  Injection technique: Single-shot  Needle Type: Echogenic Stimulator Needle     Needle Length: 10cm  Needle Gauge: 21     Additional Needles:   Procedures:,,,, ultrasound used (permanent image in chart),,,,  Narrative:  Start time: 02/15/2018 8:50 AM End time: 02/15/2018 9:00 AM Injection made incrementally with aspirations every 5 mL.  Performed by: Personally  Anesthesiologist: Leonides GrillsEllender, Wilbur Labuda P, MD  Additional Notes: Functioning IV was confirmed and monitors were applied.  A 100mm 21ga [place] echogenic stimulator needle was used. Sterile prep, hand hygiene and sterile gloves were used.  Negative aspiration and negative test dose prior to incremental administration of local anesthetic. The patient tolerated the procedure well.

## 2018-02-16 LAB — BASIC METABOLIC PANEL
ANION GAP: 7 (ref 5–15)
BUN: 15 mg/dL (ref 8–23)
CALCIUM: 8.8 mg/dL — AB (ref 8.9–10.3)
CO2: 28 mmol/L (ref 22–32)
Chloride: 106 mmol/L (ref 98–111)
Creatinine, Ser: 0.74 mg/dL (ref 0.44–1.00)
GFR calc Af Amer: >60 mL/min (ref >60)
GFR calc non Af Amer: >60 mL/min (ref >60)
GLUCOSE: 132 mg/dL — AB (ref 70–99)
POTASSIUM: 3 mmol/L — AB (ref 3.5–5.1)
Sodium: 141 mmol/L (ref 135–145)

## 2018-02-16 LAB — CBC
HEMATOCRIT: 30.8 % — AB (ref 36.0–46.0)
HEMOGLOBIN: 10.3 g/dL — AB (ref 12.0–15.0)
MCH: 28.9 pg (ref 26.0–34.0)
MCHC: 33.4 g/dL (ref 30.0–36.0)
MCV: 86.3 fL (ref 78.0–100.0)
Platelets: 294 10*3/uL (ref 150–400)
RBC: 3.57 MIL/uL — AB (ref 3.87–5.11)
RDW: 16 % — AB (ref 11.5–15.5)
WBC: 11.6 10*3/uL — AB (ref 4.0–10.5)

## 2018-02-16 MED ORDER — POTASSIUM CHLORIDE CRYS ER 20 MEQ PO TBCR
40.0000 meq | EXTENDED_RELEASE_TABLET | ORAL | Status: AC
  Administered 2018-02-16 (×3): 40 meq via ORAL
  Filled 2018-02-16 (×3): qty 2

## 2018-02-16 NOTE — Progress Notes (Signed)
Physical Therapy Treatment Patient Details Name: Peggy HalonFlorry J Harrell MRN: [id number] DOB: [birth year] Today's Date: 02/16/2018    History of Present Illness Pt is a 67 y.o. female s/p R TKA on 6/24. PMHx: Arthritis, Heart murmur, HTN.     PT Comments    Son present , reviewed sit to stand, patient will have a hospital bed at home. Reviewed therapeutic exercises with son. Patient continues to have difficulty with sit to stand/ powering up form lower surfaces. Left  Knee DJD/ crepitus  noted. Continue PT. Plans Dc tomorrow after meets PT goals.   Follow Up Recommendations  Follow surgeon's recommendation for DC plan and follow-up therapies;Outpatient PT     Equipment Recommendations  None recommended by PT    Recommendations for Other Services       Precautions / Restrictions Precautions Precautions: Knee;Fall Precaution Comments: did not use KI Required Braces or Orthoses: Knee Immobilizer - Right Knee Immobilizer - Right: Discontinue once straight leg raise with < 10 degree lag Restrictions RLE Weight Bearing: Weight bearing as tolerated    Mobility  Bed Mobility   Bed Mobility: Sit to Supine       Sit to supine: Min guard   General bed mobility comments:   Patient sitting on bed edge by assistance of the sone, Patient lifted right leg with belt onto bed, extra time.   Transfers Overall transfer level: Needs assistance Equipment used: Rolling walker (2 wheeled) Transfers: Sit to/from Stand Sit to Stand: Mod assist Stand pivot transfers: Mod assist       General transfer comment: son attempting to assist the patient to stand from low bed. KI was not properly positioned on the right leg. Removed the KI  and patient was able to stand  with less assist, pulling right foot back under her. Still required steady assist  to power up.   Noted left knee crepitus. Stood from 3 in 1 in [place] with less assist without KI on right knee. cues for hand   Ambulation/Gait Ambulation/Gait  assistance: Min assist Gait Distance (Feet): 15 Feet(then 80') Assistive device: Rolling walker (2 wheeled) Gait Pattern/deviations: Step-to pattern;Antalgic;Decreased step length - right;Decreased stance time - right Gait velocity: decr   General Gait Details: tolerated well with out KI   Stairs             Wheelchair Mobility    Modified Rankin (Stroke Patients Only)       Balance             Standing balance-Leahy Scale: Poor Standing balance comment: RW for support, at times needs  steady assist.                            Cognition Arousal/Alertness: Awake/alert                                            Exercises Total Joint Exercises Ankle Circles/Pumps: AROM;Both Quad Sets: AROM;Both;10 reps Short Arc QuadBarbaraann Boys: AAROM;Right;10 reps Heel Slides: AAROM;Right;10 reps Hip ABduction/ADduction: AAROM;Right;10 reps Straight Leg Raises: AAROM;Right;10 reps Long Arc Quad: AROM;Right;10 reps Knee Flexion: AROM;Right;10 reps Goniometric ROM: 10-50 knee flexion    General Comments        Pertinent Vitals/Pain Pain Score: 7  Pain Location: R knee Pain Descriptors / Indicators: Discomfort;Sore Pain Intervention(s): Limited activity within patient's tolerance;Patient requesting pain meds-RN  notified;Monitored during session;Ice applied    Home Living                      Prior Function            PT Goals (current goals can now be found in the care plan section) Progress towards PT goals: Progressing toward goals    Frequency    7X/week      PT Plan Current plan remains appropriate    Co-evaluation              AM-PAC PT "6 Clicks" Daily Activity  Outcome Measure  Difficulty turning over in bed (including adjusting bedclothes, sheets and blankets)?: A Lot Difficulty moving from lying on back to sitting on the side of the bed? : A Lot Difficulty sitting down on and standing up from a chair with arms  (e.g., wheelchair, bedside commode, etc,.)?: A Lot Help needed moving to and from a bed to chair (including a wheelchair)?: A Lot Help needed walking in hospital room?: A Lot Help needed climbing 3-5 steps with a railing? : Total 6 Click Score: 11    End of Session Equipment Utilized During Treatment: Gait belt Activity Tolerance: Patient tolerated treatment well Patient left: in bed;with call bell/phone within reach;with nursing/sitter in room;with family/visitor present Nurse Communication: Mobility status PT Visit Diagnosis: Unsteadiness on feet (R26.81);Pain Pain - Right/Left: Right Pain - part of body: Knee     Time: 9811-9147 PT Time Calculation (min) (ACUTE ONLY): 62 min  Charges:  $Gait Training: 8-22 mins $Therapeutic Exercise: 8-22 mins $Self Care/Home Management: 23-37                    G CodesBlanchard Kelch PT [phone]    Rada Hay 02/16/2018, 4:14 PM

## 2018-02-16 NOTE — Progress Notes (Signed)
Physical Therapy Treatment Patient Details Name: Peggy Harrell MRN: [id number] DOB: [birth year] Today's Date: 02/16/2018    History of Present Illness Pt is a 67 y.o. female s/p R TKA on 6/24. PMHx: Arthritis, Heart murmur, HTN.     PT Comments    The patient is feeling much better and tolerating mobility today.  Plans Dc to home, and to have OPPT.    Follow Up Recommendations  Follow surgeon's recommendation for DC plan and follow-up therapies;Outpatient PT     Equipment Recommendations  None recommended by PT    Recommendations for Other Services       Precautions / Restrictions Precautions Precautions: Knee;Fall Required Braces or Orthoses: Knee Immobilizer - Right Knee Immobilizer - Right: Discontinue once straight leg raise with < 10 degree lag    Mobility  Bed Mobility   Bed Mobility: Sit to Supine       Sit to supine: Min assist   General bed mobility comments: assist with the right leg onto bed  Transfers Overall transfer level: Needs assistance Equipment used: Rolling walker (2 wheeled) Transfers: Sit to/from Stand Sit to Stand: Mod assist         General transfer comment: Mod assist from recliner and BSC, difficulty with wweight on left and does not flex knee back far enough for  foot to be  under  to power up.  Ambulation/Gait Ambulation/Gait assistance: Min assist Gait Distance (Feet): 15 Feet(then 80') Assistive device: Rolling walker (2 wheeled) Gait Pattern/deviations: Step-to pattern;Antalgic;Decreased step length - right;Decreased stance time - right     General Gait Details: cues for sequence   Stairs             Wheelchair Mobility    Modified Rankin (Stroke Patients Only)       Balance                                            Cognition Arousal/Alertness: Awake/alert                                            Exercises Total Joint Exercises Ankle Circles/Pumps:  AROM;Both Quad Sets: AROM;Both Heel Slides: AAROM;Right Hip ABduction/ADduction: AAROM;Right Straight Leg Raises: AAROM;Right    General Comments        Pertinent Vitals/Pain Pain Score: 7  Pain Location: R knee Pain Descriptors / Indicators: Discomfort;Sore Pain Intervention(s): Monitored during session;Premedicated before session;Repositioned;Ice applied;RN gave pain meds during session    Home Living                      Prior Function            PT Goals (current goals can now be found in the care plan section) Progress towards PT goals: Progressing toward goals    Frequency    7X/week      PT Plan Current plan remains appropriate    Co-evaluation              AM-PAC PT "6 Clicks" Daily Activity  Outcome Measure  Difficulty turning over in bed (including adjusting bedclothes, sheets and blankets)?: A Lot Difficulty moving from lying on back to sitting on the side of the bed? : A Lot Difficulty sitting down on and standing  up from a chair with arms (e.g., wheelchair, bedside commode, etc,.)?: A Lot Help needed moving to and from a bed to chair (including a wheelchair)?: A Lot Help needed walking in hospital room?: A Lot Help needed climbing 3-5 steps with a railing? : Total 6 Click Score: 11    End of Session Equipment Utilized During Treatment: Right knee immobilizer Activity Tolerance: Patient tolerated treatment well Patient left: in bed;with call bell/phone within reach Nurse Communication: Mobility status PT Visit Diagnosis: Unsteadiness on feet (R26.81);Pain Pain - Right/Left: Right Pain - part of body: Knee     Time: 1610-9604 PT Time Calculation (min) (ACUTE ONLY): 54 min  Charges:  $Gait Training: 8-22 mins $Therapeutic Exercise: 8-22 mins $Self Care/Home Management: 8-22                    G CodesBlanchard Kelch PT [phone]    Rada Hay 02/16/2018, 1:42 PM

## 2018-02-16 NOTE — Evaluation (Signed)
Occupational Therapy Evaluation Patient Details Name: Peggy HalonFlorry J Beggs MRN: [id number] DOB: 04-23-1951 Today's Date: 02/16/2018    History of Present Illness Pt is a 67 y.o. female s/p R TKA on 6/24. PMHx: Arthritis, Heart murmur, HTN.    Clinical Impression   Pt reports she was independent with ADL PTA. Currently pt requires min guard assist for functional mobility and mod assist for LB ADL. Pt planning to d/c home with 24/7 supervision from her son. Pt would benefit from continued skilled OT to address established goals.    Follow Up Recommendations  No OT follow up;Follow surgeon's recommendation for DC plan and follow-up therapies;Supervision - Intermittent    Equipment Recommendations  None recommended by OT    Recommendations for Other Services       Precautions / Restrictions Precautions Precautions: Knee;Fall Precaution Booklet Issued: No Precaution Comments: Educated on no pillow under knee Required Braces or Orthoses: Knee Immobilizer - Right Restrictions Weight Bearing Restrictions: Yes RLE Weight Bearing: Weight bearing as tolerated      Mobility Bed Mobility Overal bed mobility: Needs Assistance Bed Mobility: Supine to Sit     Supine to sit: Supervision;HOB elevated     General bed mobility comments: Pt able to manage RLE to EOB. HOB elevated without use of bed rails  Transfers Overall transfer level: Needs assistance Equipment used: Rolling walker (2 wheeled) Transfers: Sit to/from Stand Sit to Stand: Min guard         General transfer comment: Min guard for safety, increased time and effort, cues for hand placement and technique    Balance Overall balance assessment: Needs assistance Sitting-balance support: Feet supported Sitting balance-Leahy Scale: Good     Standing balance support: Bilateral upper extremity supported Standing balance-Leahy Scale: Poor Standing balance comment: RW for support                           ADL  either performed or assessed with clinical judgement   ADL Overall ADL's : Needs assistance/impaired Eating/Feeding: Set up;Sitting   Grooming: Set up;Supervision/safety;Sitting   Upper Body Bathing: Set up;Supervision/ safety;Sitting   Lower Body Bathing: Moderate assistance;Sit to/from stand   Upper Body Dressing : Set up;Supervision/safety;Sitting   Lower Body Dressing: Moderate assistance;Sit to/from stand Lower Body Dressing Details (indicate cue type and reason): Assist for socks. Educated pt on compensatory strategies for LB ADL. Son can assist with dressing as needed Toilet Transfer: Min guard;Ambulation;BSC;RW Toilet Transfer Details (indicate cue type and reason): Simulated by sit to stand from EOB with functional mobility. Educated on use of 3 in 1 over toilet       Tub/Shower Transfer Details (indicate cue type and reason): Educated on use of 3 in 1 in shower as a seat. Pt planning to sponge bathe initially as her shower is upstairs. Would benefit from additional practice on shower transfer technique Functional mobility during ADLs: Min guard;Rolling walker       Vision         Perception     Praxis      Pertinent Vitals/Pain Pain Assessment: Faces Faces Pain Scale: Hurts little more Pain Location: R knee Pain Descriptors / Indicators: Discomfort;Sore Pain Intervention(s): Monitored during session;Repositioned;Ice applied     Hand Dominance     Extremity/Trunk Assessment Upper Extremity Assessment Upper Extremity Assessment: Overall WFL for tasks assessed   Lower Extremity Assessment Lower Extremity Assessment: Defer to PT evaluation   Cervical / Trunk Assessment Cervical / Trunk Assessment:  Normal   Communication Communication Communication: No difficulties   Cognition Arousal/Alertness: Awake/alert Behavior During Therapy: WFL for tasks assessed/performed Overall Cognitive Status: Within Functional Limits for tasks assessed                                      General Comments       Exercises     Shoulder Instructions      Home Living Family/patient expects to be discharged to:: Private residence Living Arrangements: Children Available Help at Discharge: Family;Available 24 hours/day Type of Home: House Home Access: Stairs to enter [employer] of Steps: 1 + 2   Home Layout: Two level;Bed/bath upstairs;1/2 bath on main level     Bathroom Shower/Tub: Producer, television/film/video: Standard     Home Equipment: Environmental consultant - 2 wheels;Bedside commode;Hospital bed   Additional Comments: Has hospital bed set up on first floor with 3 in 1 over toilet in half bath      Prior Functioning/Environment Level of Independence: Independent with assistive device(s)                 OT Problem List: Decreased strength;Decreased activity tolerance;Impaired balance (sitting and/or standing);Decreased knowledge of use of DME or AE;Decreased knowledge of precautions;Obesity;Pain;Increased edema      OT Treatment/Interventions: Self-care/ADL training;Energy conservation;DME and/or AE instruction;Therapeutic activities;Patient/family education;Balance training    OT Goals(Current goals can be found in the care plan section) Acute Rehab OT Goals Patient Stated Goal: to walk without pain OT Goal Formulation: With patient Time For Goal Achievement: 03/02/18 Potential to Achieve Goals: Good ADL Goals Pt Will Perform Lower Body Bathing: with supervision;sit to/from stand Pt Will Perform Lower Body Dressing: with supervision;sit to/from stand Pt Will Perform Tub/Shower Transfer: Shower transfer;with supervision;ambulating;3 in 1;rolling walker  OT Frequency: Min 2X/week   Barriers to D/C: Inaccessible home environment  bed/bath upstairs       Co-evaluation              AM-PAC PT "6 Clicks" Daily Activity     Outcome Measure Help from another person eating meals?: None Help from another  person taking care of personal grooming?: A Little Help from another person toileting, which includes using toliet, bedpan, or urinal?: A Little Help from another person bathing (including washing, rinsing, drying)?: A Lot Help from another person to put on and taking off regular upper body clothing?: A Little Help from another person to put on and taking off regular lower body clothing?: A Lot 6 Click Score: 17   End of Session Equipment Utilized During Treatment: Gait belt;Rolling walker;Right knee immobilizer CPM Right Knee CPM Right Knee: Off Nurse Communication: Mobility status  Activity Tolerance: Patient tolerated treatment well Patient left: in chair;with call bell/phone within reach  OT Visit Diagnosis: Other abnormalities of gait and mobility (R26.89);Unsteadiness on feet (R26.81);Pain Pain - Right/Left: Right Pain - part of body: Knee                Time: 0902-0926 OT Time Calculation (min): 24 min Charges:  OT General Charges $OT Visit: 1 Visit OT Evaluation $OT Eval Moderate Complexity: 1 Mod OT Treatments $Self Care/Home Management : 8-22 mins G-Codes:     Antaeus Karel A. Brett Albino, M.S., OTR/L Acute Rehab Department: [phone]  Gaye Alken 02/16/2018, 9:37 AM

## 2018-02-16 NOTE — Progress Notes (Signed)
   Subjective: 1 Day Post-Op Procedure(s) (LRB): RIGHT TOTAL KNEE ARTHROPLASTY (Right) Patient reports pain as mild.   Patient seen in rounds by Dr. Lequita HaltAluisio. Patient is well, and has had no acute complaints or problems other than pain in the right knee. Foley removed this AM, denies chest pain, SOB, or calf pain. We will continue working with therapy today.   Objective: Vital signs in last 24 hours: Temp:  [97.4 F (36.3 C)-98.1 F (36.7 C)] 97.9 F (36.6 C) (06/25 0621) Pulse Rate:  [56-94] 72 (06/25 0621) Resp:  [12-29] 18 (06/25 0621) BP: (99-157)/(59-99) 122/66 (06/25 0621) SpO2:  [95 %-100 %] 100 % (06/25 0621)  Intake/Output from previous day:  Intake/Output Summary (Last 24 hours) at 02/16/2018 0758 Last data filed at 02/16/2018 0600 Gross per 24 hour  Intake 3481.67 ml  Output 3495 ml  Net -13.33 ml    Labs: Recent Labs    02/16/18 0534  HGB 10.3*   Recent Labs    02/16/18 0534  WBC 11.6*  RBC 3.57*  HCT 30.8*  PLT 294   Recent Labs    02/16/18 0534  NA 141  K 3.0*  CL 106  CO2 28  BUN 15  CREATININE 0.74  GLUCOSE 132*  CALCIUM 8.8*   Exam: General - Patient is Alert and Oriented Extremity - Neurologically intact Neurovascular intact Sensation intact distally Dorsiflexion/Plantar flexion intact Dressing - dressing C/D/I Motor Function - intact, moving foot and toes well on exam.   Past Medical History:  Diagnosis Date  . Anemia    hx of  . Arthritis   . Complication of anesthesia    Hard to wake up  . Heart murmur   . Hypertension   . Pneumonia    as a child  . PONV (postoperative nausea and vomiting)     Assessment/Plan: 1 Day Post-Op Procedure(s) (LRB): RIGHT TOTAL KNEE ARTHROPLASTY (Right) Principal Problem:   Osteoarthritis, knee Active Problems:   OA (osteoarthritis) of knee  Estimated body mass index is 35.36 kg/m as calculated from the following:   Height as of this encounter: 5' 8.5" (1.74 m).   Weight as of this  encounter: 107 kg (236 lb). Advance diet Up with therapy  Anticipated LOS equal to or greater than 2 midnights due to - Age 67 and older with one or more of the following:  - Obesity  - Expected need for hospital services (PT, OT, Nursing) required for safe  discharge  - Anticipated need for postoperative skilled nursing care or inpatient rehab  - Active co-morbidities: None OR   - Unanticipated findings during/Post Surgery: None  - Patient is a high risk of re-admission due to: None    DVT Prophylaxis - Aspirin Weight bearing as tolerated. D/C O2 and pulse ox and try on room air. Hemovac pulled without difficulty, will continue working with therapy today.  Potassium 3.0 this AM, 3 doses of 40 mEq potassium chloride ordered. Will monitor with BMP tomorrow AM. Plan is to go Home after hospital stay. Possible discharge tomorrow if progresses with therapy and meeting goals.  Arther AbbottKristie Abygayle Deltoro, PA-C Orthopedic Surgery 02/16/2018, 7:58 AM

## 2018-02-16 NOTE — Care Management Note (Signed)
Case Management Note  Patient Details  Name: [name] MRN: [id number] Date of Birth: 08/22/1951  Subjective/Objective:PT/OT recc otpt PT per surgeons plan. No further CM needs.                    Action/Plan:d/c home.   Expected Discharge Date:                  Expected Discharge Plan:  OP Rehab  In-House Referral:     Discharge planning Services  CM Consult  Post Acute Care Choice:    Choice offered to:     DME Arranged:    DME Agency:     HH Arranged:    HH Agency:     Status of Service:  Completed, signed off  If discussed at [employer] Length of Stay Meetings, dates discussed:    Additional Comments:  Lanier ClamMahabir, Makynlee Kressin, RN 02/16/2018, 1:59 PM

## 2018-02-17 LAB — BASIC METABOLIC PANEL
Anion gap: 6 (ref 5–15)
BUN: 18 mg/dL (ref 8–23)
CALCIUM: 9 mg/dL (ref 8.9–10.3)
CO2: 29 mmol/L (ref 22–32)
CREATININE: 0.7 mg/dL (ref 0.44–1.00)
Chloride: 107 mmol/L (ref 98–111)
GFR calc Af Amer: >60 mL/min (ref >60)
Glucose, Bld: 100 mg/dL — ABNORMAL HIGH (ref 70–99)
Potassium: 3.8 mmol/L (ref 3.5–5.1)
Sodium: 142 mmol/L (ref 135–145)

## 2018-02-17 LAB — CBC
HCT: 28.5 % — ABNORMAL LOW (ref 36.0–46.0)
Hemoglobin: 9.5 g/dL — ABNORMAL LOW (ref 12.0–15.0)
MCH: 28.9 pg (ref 26.0–34.0)
MCHC: 33.3 g/dL (ref 30.0–36.0)
MCV: 86.6 fL (ref 78.0–100.0)
PLATELETS: 262 10*3/uL (ref 150–400)
RBC: 3.29 MIL/uL — ABNORMAL LOW (ref 3.87–5.11)
RDW: 16.2 % — AB (ref 11.5–15.5)
WBC: 13.1 10*3/uL — ABNORMAL HIGH (ref 4.0–10.5)

## 2018-02-17 MED ORDER — ASPIRIN 325 MG PO TBEC: 325.0000 mg | DELAYED_RELEASE_TABLET | Freq: Two times a day (BID) | ORAL | 0 refills | Status: AC

## 2018-02-17 MED ORDER — METHOCARBAMOL 500 MG PO TABS: 500.0000 mg | ORAL_TABLET | Freq: Four times a day (QID) | ORAL | 0 refills | Status: DC | PRN

## 2018-02-17 MED ORDER — OXYCODONE HCL 5 MG PO TABS: 5.0000 mg | ORAL_TABLET | Freq: Four times a day (QID) | ORAL | 0 refills | Status: DC | PRN

## 2018-02-17 MED ORDER — TRAMADOL HCL 50 MG PO TABS: 50.0000 mg | ORAL_TABLET | Freq: Four times a day (QID) | ORAL | 0 refills | Status: DC | PRN

## 2018-02-17 MED ORDER — GABAPENTIN 300 MG PO CAPS: 300.0000 mg | ORAL_CAPSULE | Freq: Three times a day (TID) | ORAL | 0 refills | Status: DC

## 2018-02-17 NOTE — Progress Notes (Signed)
Physical Therapy Treatment Patient Details Name: Peggy Harrell MRN: [id number] DOB: 1950/09/03 Today's Date: 02/17/2018    History of Present Illness Pt is a 67 y.o. female s/p R TKA on 6/24. PMHx: Arthritis, Heart murmur, HTN.     PT Comments    POD # 2 am session Noted increased knee stability when pt amb out of bathroom with NT.  Applied KI.  Assisted out of bathroom to amb a greater distance in hallway.  Assisted back to bed per pt request to rest.  Pt will need another PT session to address stairs and HEP.   Follow Up Recommendations  Follow surgeon's recommendation for DC plan and follow-up therapies;Outpatient PT     Equipment Recommendations  None recommended by PT    Recommendations for Other Services       Precautions / Restrictions Precautions Precautions: Knee;Fall Precaution Comments: Instructed on KI use and when to D/C Required Braces or Orthoses: Knee Immobilizer - Right Knee Immobilizer - Right: Discontinue once straight leg raise with < 10 degree lag Restrictions Weight Bearing Restrictions: No RLE Weight Bearing: Weight bearing as tolerated    Mobility  Bed Mobility Overal bed mobility: Needs Assistance Bed Mobility: Sit to Supine       Sit to supine: Min assist   General bed mobility comments: assist R LE and increased time   Transfers Overall transfer level: Needs assistance Equipment used: Rolling walker (2 wheeled) Transfers: Sit to/from Stand Sit to Stand: Min guard;Mod assist;From elevated surface         General transfer comment: 25% VC's on proper hand placement and safety with turns  Ambulation/Gait Ambulation/Gait assistance: Supervision;Min guard Gait Distance (Feet): 45 Feet Assistive device: Rolling walker (2 wheeled) Gait Pattern/deviations: Step-to pattern;Antalgic;Decreased step length - right;Decreased stance time - right Gait velocity: decr   General Gait Details: required need for KI this session and was able to  amb an increased distance.  Mod c/o fatigue   Stairs             Wheelchair Mobility    Modified Rankin (Stroke Patients Only)       Balance                                            Cognition Arousal/Alertness: Awake/alert Behavior During Therapy: WFL for tasks assessed/performed Overall Cognitive Status: Within Functional Limits for tasks assessed                                        Exercises      General Comments        Pertinent Vitals/Pain Pain Assessment: 0-10 Pain Score: 2  Faces Pain Scale: Hurts a little bit Pain Location: R knee Pain Descriptors / Indicators: Discomfort;Sore Pain Intervention(s): Monitored during session;Repositioned    Home Living                      Prior Function            PT Goals (current goals can now be found in the care plan section) Progress towards PT goals: Progressing toward goals    Frequency    7X/week      PT Plan Current plan remains appropriate    Co-evaluation  AM-PAC PT "6 Clicks" Daily Activity  Outcome Measure  Difficulty turning over in bed (including adjusting bedclothes, sheets and blankets)?: A Lot Difficulty moving from lying on back to sitting on the side of the bed? : A Lot Difficulty sitting down on and standing up from a chair with arms (e.g., wheelchair, bedside commode, etc,.)?: A Lot Help needed moving to and from a bed to chair (including a wheelchair)?: A Lot Help needed walking in hospital room?: A Lot Help needed climbing 3-5 steps with a railing? : A Lot 6 Click Score: 12    End of Session Equipment Utilized During Treatment: Gait belt Activity Tolerance: Patient tolerated treatment well Patient left: in bed;with call bell/phone within reach;with nursing/sitter in room;with family/visitor present Nurse Communication: Mobility status PT Visit Diagnosis: Unsteadiness on feet (R26.81);Pain Pain - Right/Left:  Right Pain - part of body: Knee     Time: [id number] PT Time Calculation (min) (ACUTE ONLY): 30 min  Charges:  $Gait Training: 8-22 mins $Therapeutic Activity: 8-22 mins                    G Codes:       Felecia ShellingLori Jaiyana Canale  PTA [facility]  Acute  Rehab Pager      [phone]

## 2018-02-17 NOTE — Progress Notes (Signed)
Patient was discharged via wheelchair, on room air escorted by Nurse tech, in good spirit with no signs of distress noted. Patient's discharge papers was given by previous nurse with instructions given. Personal belongings were brought by family.

## 2018-02-17 NOTE — Progress Notes (Signed)
Occupational Therapy Treatment Patient Details Name: Peggy HalonFlorry J Cortner MRN: [id number] DOB: [birth year] Today's Date: 02/17/2018    History of present illness Pt is a 67 y.o. female s/p R TKA on 6/24. PMHx: Arthritis, Heart murmur, HTN.    OT comments  Completed adl and educated on sitting options for home.  Had nausea which resolved.    Follow Up Recommendations  No OT follow up;Follow surgeon's recommendation for DC plan and follow-up therapies;Supervision - Intermittent    Equipment Recommendations  None recommended by OT    Recommendations for Other Services      Precautions / Restrictions Precautions Precautions: Knee;Fall Precaution Comments: did not use KI Required Braces or Orthoses: Knee Immobilizer - Right Knee Immobilizer - Right: Discontinue once straight leg raise with < 10 degree lag Restrictions RLE Weight Bearing: Weight bearing as tolerated       Mobility Bed Mobility         Supine to sit: Min assist     General bed mobility comments: for RLE  Transfers   Equipment used: Rolling walker (2 wheeled)   Sit to Stand: Min guard;Mod assist;From elevated surface         General transfer comment: first stand was mod A then min for next couple. Transitions are slow. Assist to rise and shift forward    Balance                                           ADL either performed or assessed with clinical judgement   ADL           Upper Body Bathing: Set up;Supervision/ safety;Sitting   Lower Body Bathing: Moderate assistance;Sit to/from stand   Upper Body Dressing : Set up;Sitting   Lower Body Dressing: Maximal assistance;Sit to/from stand   Toilet Transfer: Minimal assistance;Stand-pivot;RW(chair)             General ADL Comments: performed ADL from EOB. Pt is unable to lift RLE on her own. Did not use KI and she didn't use it yesterday, and it was easier for her to stand.  Educated on reacher, but she would have to use  LLE to cross under RLE to clear floor. She will have her son assist her.  Min support in standing for hygiene and to pull pants up.  She will sponge bathe as her shower is upstairs. Pt felt nauseous after completing ADL.  Resolved after a couple of minutes. Pt was thinking of just sitting EOB at home. Educated that she should sit in chair to keep leg straight and elevated. She has a low sectional couch.  Son may be able to place bed risers under it (if safe/stable to do this) vs borrowing a chair and using stool with pillows or trashcan with pillows to elevate leg and keep straight. She only has high bar stool type chairs at home and no ottoman     Vision       Perception     Praxis      Cognition Arousal/Alertness: Awake/alert Behavior During Therapy: WFL for tasks assessed/performed Overall Cognitive Status: Within Functional Limits for tasks assessed                                          Exercises  Shoulder Instructions       General Comments      Pertinent Vitals/ Pain       Faces Pain Scale: Hurts little more Pain Location: R knee Pain Descriptors / Indicators: Discomfort;Sore Pain Intervention(s): Limited activity within patient's tolerance;Monitored during session;Premedicated before session;Repositioned  Home Living                                          Prior Functioning/Environment              Frequency           Progress Toward Goals  OT Goals(current goals can now be found in the care plan section)  Progress towards OT goals: Progressing toward goals     Plan      Co-evaluation                 AM-PAC PT "6 Clicks" Daily Activity     Outcome Measure   Help from another person eating meals?: None Help from another person taking care of personal grooming?: A Little Help from another person toileting, which includes using toliet, bedpan, or urinal?: A Little Help from another person bathing  (including washing, rinsing, drying)?: A Lot Help from another person to put on and taking off regular upper body clothing?: A Little Help from another person to put on and taking off regular lower body clothing?: A Lot 6 Click Score: 17    End of Session    OT Visit Diagnosis: Unsteadiness on feet (R26.81);Pain Pain - Right/Left: Right Pain - part of body: Leg   Activity Tolerance Patient tolerated treatment well   Patient Left in chair;with call bell/phone within reach   Nurse Communication          Time: 0828-0920 OT Time Calculation (min): 52 min  Charges: OT General Charges $OT Visit: 1 Visit OT Treatments $Self Care/Home Management : 23-37 mins $Therapeutic Activity: 8-22 mins  Marica Otter, OTR/L [phone] 02/17/2018   Justyce Yeater 02/17/2018, 9:47 AM

## 2018-02-17 NOTE — Progress Notes (Signed)
   Subjective: 2 Days Post-Op Procedure(s) (LRB): RIGHT TOTAL KNEE ARTHROPLASTY (Right) Patient reports pain as mild.   Patient seen in rounds with Dr. Lequita HaltAluisio. Patient is well, and has had no acute complaints or problems. Voiding without difficulty and positive flatus. Denies chest pain, SOB, or calf pain. Plan is to go Home after hospital stay.  Objective: Vital signs in last 24 hours: Temp:  [97.7 F (36.5 C)-98.6 F (37 C)] 98.5 F (36.9 C) (06/26 0554) Pulse Rate:  [77-87] 79 (06/26 0554) Resp:  [16-17] 17 (06/26 0554) BP: (111-145)/(60-78) 111/60 (06/26 0554) SpO2:  [97 %-98 %] 97 % (06/26 0554)  Intake/Output from previous day:  Intake/Output Summary (Last 24 hours) at 02/17/2018 0746 Last data filed at 02/17/2018 0200 Gross per 24 hour  Intake 1373.75 ml  Output 1300 ml  Net 73.75 ml    Labs: Recent Labs    02/16/18 0534 02/17/18 0540  HGB 10.3* 9.5*   Recent Labs    02/16/18 0534 02/17/18 0540  WBC 11.6* 13.1*  RBC 3.57* 3.29*  HCT 30.8* 28.5*  PLT 294 262   Recent Labs    02/16/18 0534 02/17/18 0540  NA 141 142  K 3.0* 3.8  CL 106 107  CO2 28 29  BUN 15 18  CREATININE 0.74 0.70  GLUCOSE 132* 100*  CALCIUM 8.8* 9.0   Exam: General - Patient is Alert and Oriented Extremity - Neurologically intact Neurovascular intact Sensation intact distally Dorsiflexion/Plantar flexion intact Dressing/Incision - clean, dry, no drainage Motor Function - intact, moving foot and toes well on exam.   Past Medical History:  Diagnosis Date  . Anemia    hx of  . Arthritis   . Complication of anesthesia    Hard to wake up  . Heart murmur   . Hypertension   . Pneumonia    as a child  . PONV (postoperative nausea and vomiting)     Assessment/Plan: 2 Days Post-Op Procedure(s) (LRB): RIGHT TOTAL KNEE ARTHROPLASTY (Right) Principal Problem:   Osteoarthritis, knee Active Problems:   OA (osteoarthritis) of knee  Estimated body mass index is 35.36 kg/m  as calculated from the following:   Height as of this encounter: 5' 8.5" (1.74 m).   Weight as of this encounter: 107 kg (236 lb). Up with therapy D/C IV fluids  DVT Prophylaxis - Aspirin Weight-bearing as tolerated  Plan for discharge to home today if progresses with therapy and meeting goals. [facility] Follow-up in the office in 2 weeks with Dr. Lequita HaltAluisio.  Arther AbbottKristie Wandra Babin, PA-C Orthopedic Surgery 02/17/2018, 7:46 AM

## 2018-02-17 NOTE — Progress Notes (Signed)
Physical Therapy Treatment Patient Details Name: Peggy Harrell MRN: [id number] DOB: 1951-07-11 Today's Date: 02/17/2018    History of Present Illness Pt is a 67 y.o. female s/p R TKA on 6/24. PMHx: Arthritis, Heart murmur, HTN.     PT Comments    POD # 2 pm session Assisted with amb a greater distance in hallway and practiced one step.  Performed all supine TKR TE's following HEP handout. Pt has met goals to D/C to home.     Follow Up Recommendations  Follow surgeon's recommendation for DC plan and follow-up therapies;Outpatient PT     Equipment Recommendations  None recommended by PT    Recommendations for Other Services       Precautions / Restrictions Precautions Precautions: Knee;Fall Precaution Comments: Instructed on KI use and when to D/C Required Braces or Orthoses: Knee Immobilizer - Right Knee Immobilizer - Right: Discontinue once straight leg raise with < 10 degree lag Restrictions Weight Bearing Restrictions: No RLE Weight Bearing: Weight bearing as tolerated    Mobility  Bed Mobility Overal bed mobility: Needs Assistance Bed Mobility: Sit to Supine     Supine to sit: Min assist Sit to supine: Min assist   General bed mobility comments: assist R LE and increased time   Transfers Overall transfer level: Needs assistance Equipment used: Rolling walker (2 wheeled) Transfers: Sit to/from Stand Sit to Stand: Min guard;Mod assist;From elevated surface Stand pivot transfers: Mod assist       General transfer comment: 25% VC's on proper hand placement and safety with turns  Ambulation/Gait Ambulation/Gait assistance: Supervision;Min guard Gait Distance (Feet): 38 Feet Assistive device: Rolling walker (2 wheeled) Gait Pattern/deviations: Step-to pattern;Antalgic;Decreased step length - right;Decreased stance time - right Gait velocity: decr   General Gait Details: required need for KI this session and was able to amb an increased distance.  Mod  c/o fatigue   Stairs Stairs: Yes Stairs assistance: Min assist   Number of Stairs: 1 General stair comments: 50% VC's on proper sequencing and walker placement   Wheelchair Mobility    Modified Rankin (Stroke Patients Only)       Balance                                            Cognition Arousal/Alertness: Awake/alert Behavior During Therapy: WFL for tasks assessed/performed Overall Cognitive Status: Within Functional Limits for tasks assessed                                        Exercises      General Comments        Pertinent Vitals/Pain Pain Assessment: 0-10 Pain Score: 3  Faces Pain Scale: Hurts a little bit Pain Location: R knee Pain Descriptors / Indicators: Discomfort;Sore Pain Intervention(s): Monitored during session;Repositioned    Home Living                      Prior Function            PT Goals (current goals can now be found in the care plan section) Progress towards PT goals: Progressing toward goals    Frequency    7X/week      PT Plan Current plan remains appropriate    Co-evaluation  AM-PAC PT "6 Clicks" Daily Activity  Outcome Measure  Difficulty turning over in bed (including adjusting bedclothes, sheets and blankets)?: A Lot Difficulty moving from lying on back to sitting on the side of the bed? : A Lot Difficulty sitting down on and standing up from a chair with arms (e.g., wheelchair, bedside commode, etc,.)?: A Lot Help needed moving to and from a bed to chair (including a wheelchair)?: A Lot Help needed walking in hospital room?: A Lot Help needed climbing 3-5 steps with a railing? : A Lot 6 Click Score: 12    End of Session Equipment Utilized During Treatment: Gait belt Activity Tolerance: Patient tolerated treatment well Patient left: in bed;with call bell/phone within reach;with nursing/sitter in room;with family/visitor present Nurse  Communication: Mobility status PT Visit Diagnosis: Unsteadiness on feet (R26.81);Pain Pain - Right/Left: Right Pain - part of body: Knee     Time: 1410-1430 PT Time Calculation (min) (ACUTE ONLY): 20 min  Charges:  $Gait Training: 8-22 mins $Therapeutic Activity: 8-22 mins                    G Codes:       Rica Koyanagi  PTA [facility]  Acute  Rehab Pager      [phone]

## 2018-02-17 NOTE — Progress Notes (Signed)
Pt and pt's family member were provided with discharge instructions and prescriptions. Pt and pt's family member report no further questions or concerns.

## 2018-02-18 ENCOUNTER — Telehealth: Payer: Self-pay | Admitting: *Deleted

## 2018-02-18 NOTE — Telephone Encounter (Signed)
Lm requesting return call to complete TCM and confirm hosp f/u appt  

## 2018-02-18 NOTE — Telephone Encounter (Signed)
Patient was returning a missed call. Patient states that she no longer sees [place]. [facility] . Please advise. Thank you

## 2018-02-23 NOTE — Discharge Summary (Signed)
Physician Discharge Summary   Patient ID: Peggy Harrell MRN: [id number] DOB/AGE: Feb 26, 1951 67 y.o.  Admit date: 02/15/2018 Discharge date: 02/17/2018  Primary Diagnosis: Osteoarthritis right knee   Admission Diagnoses:  Past Medical History:  Diagnosis Date  . Anemia    hx of  . Arthritis   . Complication of anesthesia    Hard to wake up  . Heart murmur   . Hypertension   . Pneumonia    as a child  . PONV (postoperative nausea and vomiting)    Discharge Diagnoses:   Principal Problem:   Osteoarthritis, knee Active Problems:   OA (osteoarthritis) of knee  Estimated body mass index is 35.36 kg/m as calculated from the following:   Height as of this encounter: 5' 8.5" (1.74 m).   Weight as of this encounter: 107 kg (236 lb).  Procedure:  Procedure(s) (LRB): RIGHT TOTAL KNEE ARTHROPLASTY (Right)   Consults: None  HPI: Peggy Harrell is a 67 y.o. year old female with end stage OA of her right knee with progressively worsening pain and dysfunction. She has constant pain, with activity and at rest and significant functional deficits with difficulties even with ADLs. She has had extensive non-op management including analgesics, injections of cortisone, and home exercise program, but remains in significant pain with significant dysfunction.Radiographs show bone on bone arthritis medial and patellofemoral. She presents now for right Total Knee Arthroplasty.    Laboratory Data: Admission on 02/15/2018, Discharged on 02/17/2018  Component Date Value Ref Range Status  . WBC 02/16/2018 11.6* 4.0 - 10.5 K/uL Final  . RBC 02/16/2018 3.57* 3.87 - 5.11 MIL/uL Final  . Hemoglobin 02/16/2018 10.3* 12.0 - 15.0 g/dL Final  . HCT 02/16/2018 30.8* 36.0 - 46.0 % Final  . MCV 02/16/2018 86.3  78.0 - 100.0 fL Final  . MCH 02/16/2018 28.9  26.0 - 34.0 pg Final  . MCHC 02/16/2018 33.4  30.0 - 36.0 g/dL Final  . RDW 02/16/2018 16.0* 11.5 - 15.5 % Final  . Platelets 02/16/2018 294  150 -  400 K/uL Final   Performed at [facility], [place] [address] [place], [place] [address]  . Sodium 02/16/2018 141  135 - 145 mmol/L Final  . Potassium 02/16/2018 3.0* 3.5 - 5.1 mmol/L Final  . Chloride 02/16/2018 106  98 - 111 mmol/L Final  . CO2 02/16/2018 28  22 - 32 mmol/L Final  . Glucose, Bld 02/16/2018 132* 70 - 99 mg/dL Final  . BUN 02/16/2018 15  8 - 23 mg/dL Final  . Creatinine, Ser 02/16/2018 0.74  0.44 - 1.00 mg/dL Final  . Calcium 02/16/2018 8.8* 8.9 - 10.3 mg/dL Final  . GFR calc non Af Amer 02/16/2018 >60  >60 mL/min Final  . GFR calc Af Amer 02/16/2018 >60  >60 mL/min Final   Comment: (NOTE) The eGFR has been calculated using the CKD EPI equation. This calculation has not been validated in all clinical situations. eGFR's persistently <60 mL/min signify possible Chronic Kidney Disease.   Georgiann Hahn gap 02/16/2018 7  5 - 15 Final   Performed at [facility], [place] [address], [place], [place] [address]  . WBC 02/17/2018 13.1* 4.0 - 10.5 K/uL Final  . RBC 02/17/2018 3.29* 3.87 - 5.11 MIL/uL Final  . Hemoglobin 02/17/2018 9.5* 12.0 - 15.0 g/dL Final  . HCT 02/17/2018 28.5* 36.0 - 46.0 % Final  . MCV 02/17/2018 86.6  78.0 - 100.0 fL Final  . MCH 02/17/2018 28.9  26.0 -  34.0 pg Final  . MCHC 02/17/2018 33.3  30.0 - 36.0 g/dL Final  . RDW 02/17/2018 16.2* 11.5 - 15.5 % Final  . Platelets 02/17/2018 262  150 - 400 K/uL Final   Performed at [facility], [place] [address] [place], [place] [address]  . Sodium 02/17/2018 142  135 - 145 mmol/L Final  . Potassium 02/17/2018 3.8  3.5 - 5.1 mmol/L Final   Comment: DELTA CHECK NOTED REPEATED TO VERIFY NO VISIBLE HEMOLYSIS   . Chloride 02/17/2018 107  98 - 111 mmol/L Final   Please note change in reference range.  . CO2 02/17/2018 29  22 - 32 mmol/L Final  . Glucose, Bld 02/17/2018 100* 70 - 99 mg/dL Final   Please note change in reference range.  . BUN 02/17/2018 18  8 - 23 mg/dL  Final   Please note change in reference range.  . Creatinine, Ser 02/17/2018 0.70  0.44 - 1.00 mg/dL Final  . Calcium 02/17/2018 9.0  8.9 - 10.3 mg/dL Final  . GFR calc non Af Amer 02/17/2018 >60  >60 mL/min Final  . GFR calc Af Amer 02/17/2018 >60  >60 mL/min Final   Comment: (NOTE) The eGFR has been calculated using the CKD EPI equation. This calculation has not been validated in all clinical situations. eGFR's persistently <60 mL/min signify possible Chronic Kidney Disease.   Georgiann Hahn gap 02/17/2018 6  5 - 15 Final   Performed at [facility], [place] [address] [place], [place] [address]  Hospital Outpatient Visit on 02/09/2018  Component Date Value Ref Range Status  . aPTT 02/09/2018 34  24 - 36 seconds Final   Performed at [facility], [place] [address] [place], [place] [address]  . WBC 02/09/2018 8.2  4.0 - 10.5 K/uL Final  . RBC 02/09/2018 3.98  3.87 - 5.11 MIL/uL Final  . Hemoglobin 02/09/2018 11.4* 12.0 - 15.0 g/dL Final  . HCT 02/09/2018 34.7* 36.0 - 46.0 % Final  . MCV 02/09/2018 87.2  78.0 - 100.0 fL Final  . MCH 02/09/2018 28.6  26.0 - 34.0 pg Final  . MCHC 02/09/2018 32.9  30.0 - 36.0 g/dL Final  . RDW 02/09/2018 16.0* 11.5 - 15.5 % Final  . Platelets 02/09/2018 318  150 - 400 K/uL Final   Performed at [facility], [place] [address] [place],  [address]  . Sodium 02/09/2018 143  135 - 145 mmol/L Final  . Potassium 02/09/2018 3.6  3.5 - 5.1 mmol/L Final  . Chloride 02/09/2018 105  101 - 111 mmol/L Final  . CO2 02/09/2018 33* 22 - 32 mmol/L Final  . Glucose, Bld 02/09/2018 88  65 - 99 mg/dL Final  . BUN 02/09/2018 15  6 - 20 mg/dL Final  . Creatinine, Ser 02/09/2018 0.76  0.44 - 1.00 mg/dL Final  . Calcium 02/09/2018 9.2  8.9 - 10.3 mg/dL Final  . Total Protein 02/09/2018 6.8  6.5 - 8.1 g/dL Final  . Albumin 02/09/2018 3.5  3.5 - 5.0 g/dL Final  . AST 02/09/2018 16  15 - 41 U/L Final  . ALT 02/09/2018 16  14  - 54 U/L Final  . Alkaline Phosphatase 02/09/2018 100  38 - 126 U/L Final  . Total Bilirubin 02/09/2018 0.8  0.3 - 1.2 mg/dL Final  . GFR calc non Af Amer 02/09/2018 >60  >60 mL/min Final  . GFR calc Af Amer 02/09/2018 >60  >60 mL/min Final   Comment: (NOTE)  The eGFR has been calculated using the CKD EPI equation. This calculation has not been validated in all clinical situations. eGFR's persistently <60 mL/min signify possible Chronic Kidney Disease.   Georgiann Hahn gap 02/09/2018 5  5 - 15 Final   Performed at [facility] [place] [address] [place], [place] [address]  . Prothrombin Time 02/09/2018 12.8  11.4 - 15.2 seconds Final  . INR 02/09/2018 0.97   Final   Performed at [facility] [place] [address] [place], [place] [address]  . ABO/RH(D) 02/09/2018 O POS   Final  . Antibody Screen 02/09/2018 NEG   Final  . Sample Expiration 02/09/2018 02/18/2018   Final  . Extend sample reason 02/09/2018    Final                   Value:NO TRANSFUSIONS OR PREGNANCY IN THE PAST 3 MONTHS Performed at [facility], [place] [address] [place], [place] [address]   . Color, Urine 02/09/2018 YELLOW  YELLOW Final  . APPearance 02/09/2018 CLEAR  CLEAR Final  . Specific Gravity, Urine 02/09/2018 1.014  1.005 - 1.030 Final  . pH 02/09/2018 6.0  5.0 - 8.0 Final  . Glucose, UA 02/09/2018 NEGATIVE  NEGATIVE mg/dL Final  . Hgb urine dipstick 02/09/2018 SMALL* NEGATIVE Final  . Bilirubin Urine 02/09/2018 NEGATIVE  NEGATIVE Final  . Ketones, ur 02/09/2018 NEGATIVE  NEGATIVE mg/dL Final  . Protein, ur 02/09/2018 NEGATIVE  NEGATIVE mg/dL Final  . Nitrite 02/09/2018 POSITIVE* NEGATIVE Final  . Leukocytes, UA 02/09/2018 MODERATE* NEGATIVE Final  . RBC / HPF 02/09/2018 6-10  0 - 5 RBC/hpf Final  . WBC, UA 02/09/2018 >50* 0 - 5 WBC/hpf Final  . Bacteria, UA 02/09/2018 MANY* NONE SEEN Final  . Squamous Epithelial / LPF 02/09/2018 0-5  0 - 5 Final  . WBC Clumps  02/09/2018 PRESENT   Final  . Mucus 02/09/2018 PRESENT   Final   Performed at [facility], [place] [address], [place], [place] [address]  . MRSA, PCR 02/09/2018 NEGATIVE  NEGATIVE Final  . Staphylococcus aureus 02/09/2018 POSITIVE* NEGATIVE Final   Comment: (NOTE) The Xpert SA Assay (FDA approved for NASAL specimens in patients 67 years of age and older), is one component of a comprehensive surveillance program. It is not intended to diagnose infection nor to guide or monitor treatment. Performed at [facility], [place] [address] [place], [place] [address]   . ABO/RH(D) 02/09/2018    Final                   Value:O POS Performed at [facility], [place] [address], [place], [place] [address]      X-Rays:No results found.  EKG: Orders placed or performed during the hospital encounter of 02/09/18  . EKG 12 lead  . EKG 12 lead     Hospital Course: Peggy Harrell is a 67 y.o. who was admitted to [facility]. They were brought to the operating room on 02/15/2018 and underwent Procedure(s): RIGHT TOTAL KNEE ARTHROPLASTY.  Patient tolerated the procedure well and was later transferred to the recovery room and then to the orthopaedic floor for postoperative care.  They were given PO and IV analgesics for pain control following their surgery.  They were given 24 hours of postoperative antibiotics of  Anti-infectives (From admission, onward)   Start     Dose/Rate Route Frequency Ordered Stop   02/15/18 1600  ceFAZolin (  ANCEF) IVPB 2g/100 mL premix     2 g 200 mL/hr over 30 Minutes Intravenous Every 6 hours 02/15/18 1342 02/15/18 2149   02/15/18 0700  ceFAZolin (ANCEF) IVPB 2g/100 mL premix     2 g 200 mL/hr over 30 Minutes Intravenous On call to O.R. 02/15/18 8182 02/15/18 1001     and started on DVT prophylaxis in the form of Aspirin.   PT and OT were ordered for total joint protocol.  Discharge planning consulted to help with  postop disposition and equipment needs.  Patient had a good night on the evening of surgery. She started to get up OOB with therapy on POD #0. Hemovac drain was pulled without difficulty and patient continued to work with therapy on POD #1. Patient was seen during rounds on POD #2 and she was ready to go home pending progress with therapy. Dressing was changed and the incision was clean, dry, and intact with no drainage. Patient worked with therapy for two sessions and was meeting her goals. She was discharged to home later that day in stable condition.  Diet: Cardiac diet Activity:WBAT Follow-up:in 2 weeks in the office with Dr. Wynelle Link Disposition - Home with outpatient physical therapy at [facility] Discharged Condition: stable   Discharge Instructions    Call MD / Call 911   Complete by:  As directed    If you experience chest pain or shortness of breath, CALL 911 and be transported to the hospital emergency room.  If you develope a fever above 101 F, pus (white drainage) or increased drainage or redness at the wound, or calf pain, call your surgeon's office.   Change dressing   Complete by:  As directed    Change the dressing daily with sterile 4 x 4 inch gauze dressing and apply TED hose.  You may clean the incision with alcohol prior to redressing.   Constipation Prevention   Complete by:  As directed    Drink plenty of fluids.  Prune juice may be helpful.  You may use a stool softener, such as Colace (over the counter) 100 mg twice a day.  Use MiraLax (over the counter) for constipation as needed.   Diet - low sodium heart healthy   Complete by:  As directed    Discharge instructions   Complete by:  As directed    Dr. Gaynelle Arabian Total Joint Specialist [facility] [address] [place], [place] [address] [phone]  TOTAL KNEE REPLACEMENT POSTOPERATIVE DIRECTIONS  Knee Rehabilitation, Guidelines Following Surgery  Results after knee surgery are often  greatly improved when you follow the exercise, range of motion and muscle strengthening exercises prescribed by your doctor. Safety measures are also important to protect the knee from further injury. Any time any of these exercises cause you to have increased pain or swelling in your knee joint, decrease the amount until you are comfortable again and slowly increase them. If you have problems or questions, call your caregiver or physical therapist for advice.   HOME CARE INSTRUCTIONS  Remove items at home which could result in a fall. This includes throw rugs or furniture in walking pathways.  ICE to the affected knee every three hours for 30 minutes at a time and then as needed for pain and swelling.  Continue to use ice on the knee for pain and swelling from surgery. You may notice swelling that will progress down to the foot and ankle.  This is normal after surgery.  Elevate the  leg when you are not up walking on it.   Continue to use the breathing machine which will help keep your temperature down.  It is common for your temperature to cycle up and down following surgery, especially at night when you are not up moving around and exerting yourself.  The breathing machine keeps your lungs expanded and your temperature down. Do not place pillow under knee, focus on keeping the knee straight while resting   DIET You may resume your previous home diet once your are discharged from the hospital.  DRESSING / WOUND CARE / SHOWERING You may shower 3 days after surgery, but keep the wounds dry during showering.  You may use an occlusive plastic wrap (Press'n Seal for example), NO SOAKING/SUBMERGING IN THE BATHTUB.  If the bandage gets wet, change with a clean dry gauze.  If the incision gets wet, pat the wound dry with a clean towel. You may start showering once you are discharged home but do not submerge the incision under water. Just pat the incision dry and apply a dry gauze dressing on daily. Change the  surgical dressing daily and reapply a dry dressing each time.  ACTIVITY Walk with your walker as instructed. Use walker as long as suggested by your caregivers. Avoid periods of inactivity such as sitting longer than an hour when not asleep. This helps prevent blood clots.  You may resume a sexual relationship in one month or when given the OK by your doctor.  You may return to work once you are cleared by your doctor.  Do not drive a car for 6 weeks or until released by you surgeon.  Do not drive while taking narcotics.  WEIGHT BEARING Weight bearing as tolerated with assist device (walker, cane, etc) as directed, use it as long as suggested by your surgeon or therapist, typically at least 4-6 weeks.  POSTOPERATIVE CONSTIPATION PROTOCOL Constipation - defined medically as fewer than three stools per week and severe constipation as less than one stool per week.  One of the most common issues patients have following surgery is constipation.  Even if you have a regular bowel pattern at home, your normal regimen is likely to be disrupted due to multiple reasons following surgery.  Combination of anesthesia, postoperative narcotics, change in appetite and fluid intake all can affect your bowels.  In order to avoid complications following surgery, here are some recommendations in order to help you during your recovery period.  Colace (docusate) - Pick up an over-the-counter form of Colace or another stool softener and take twice a day as long as you are requiring postoperative pain medications.  Take with a full glass of water daily.  If you experience loose stools or diarrhea, hold the colace until you stool forms back up.  If your symptoms do not get better within 1 week or if they get worse, check with your doctor.  Dulcolax (bisacodyl) - Pick up over-the-counter and take as directed by the product packaging as needed to assist with the movement of your bowels.  Take with a full glass of water.   Use this product as needed if not relieved by Colace only.   MiraLax (polyethylene glycol) - Pick up over-the-counter to have on hand.  MiraLax is a solution that will increase the amount of water in your bowels to assist with bowel movements.  Take as directed and can mix with a glass of water, juice, soda, coffee, or tea.  Take if you go more than  two days without a movement. Do not use MiraLax more than once per day. Call your doctor if you are still constipated or irregular after using this medication for 7 days in a row.  If you continue to have problems with postoperative constipation, please contact the office for further assistance and recommendations.  If you experience "the worst abdominal pain ever" or develop nausea or vomiting, please contact the office immediatly for further recommendations for treatment.  ITCHING  If you experience itching with your medications, try taking only a single pain pill, or even half a pain pill at a time.  You can also use Benadryl over the counter for itching or also to help with sleep.   TED HOSE STOCKINGS Wear the elastic stockings on both legs for three weeks following surgery during the day but you may remove then at night for sleeping.  MEDICATIONS See your medication summary on the "After Visit Summary" that the nursing staff will review with you prior to discharge.  You may have some home medications which will be placed on hold until you complete the course of blood thinner medication.  It is important for you to complete the blood thinner medication as prescribed by your surgeon.  Continue your approved medications as instructed at time of discharge.  PRECAUTIONS If you experience chest pain or shortness of breath - call 911 immediately for transfer to the hospital emergency department.  If you develop a fever greater that 101 F, purulent drainage from wound, increased redness or drainage from wound, foul odor from the wound/dressing, or calf  pain - CONTACT YOUR SURGEON.                                                   FOLLOW-UP APPOINTMENTS Make sure you keep all of your appointments after your operation with your surgeon and caregivers. You should call the office at the above phone number and make an appointment for approximately two weeks after the date of your surgery or on the date instructed by your surgeon outlined in the "After Visit Summary".   RANGE OF MOTION AND STRENGTHENING EXERCISES  Rehabilitation of the knee is important following a knee injury or an operation. After just a few days of immobilization, the muscles of the thigh which control the knee become weakened and shrink (atrophy). Knee exercises are designed to build up the tone and strength of the thigh muscles and to improve knee motion. Often times heat used for twenty to thirty minutes before working out will loosen up your tissues and help with improving the range of motion but do not use heat for the first two weeks following surgery. These exercises can be done on a training (exercise) mat, on the floor, on a table or on a bed. Use what ever works the best and is most comfortable for you Knee exercises include:  Leg Lifts - While your knee is still immobilized in a splint or cast, you can do straight leg raises. Lift the leg to 60 degrees, hold for 3 sec, and slowly lower the leg. Repeat 10-20 times 2-3 times daily. Perform this exercise against resistance later as your knee gets better.  Quad and Hamstring Sets - Tighten up the muscle on the front of the thigh (Quad) and hold for 5-10 sec. Repeat this 10-20 times hourly. Hamstring sets are  done by pushing the foot backward against an object and holding for 5-10 sec. Repeat as with quad sets.  Leg Slides: Lying on your back, slowly slide your foot toward your buttocks, bending your knee up off the floor (only go as far as is comfortable). Then slowly slide your foot back down until your leg is flat on the floor  again. Angel Wings: Lying on your back spread your legs to the side as far apart as you can without causing discomfort.  A rehabilitation program following serious knee injuries can speed recovery and prevent re-injury in the future due to weakened muscles. Contact your doctor or a physical therapist for more information on knee rehabilitation.   IF YOU ARE TRANSFERRED TO A SKILLED REHAB FACILITY If the patient is transferred to a skilled rehab facility following release from the hospital, a list of the current medications will be sent to the facility for the patient to continue.  When discharged from the skilled rehab facility, please have the facility set up the patient's [place] prior to being released. Also, the skilled facility will be responsible for providing the patient with their medications at time of release from the facility to include their pain medication, the muscle relaxants, and their blood thinner medication. If the patient is still at the rehab facility at time of the two week follow up appointment, the skilled rehab facility will also need to assist the patient in arranging follow up appointment in our office and any transportation needs.  MAKE SURE YOU:  Understand these instructions.  Get help right away if you are not doing well or get worse.    Pick up stool softner and laxative for home use following surgery while on pain medications. Do not submerge incision under water. Please use good hand washing techniques while changing dressing each day. May shower starting three days after surgery. Please use a clean towel to pat the incision dry following showers. Continue to use ice for pain and swelling after surgery. Do not use any lotions or creams on the incision until instructed by your surgeon.   Do not put a pillow under the knee. Place it under the heel.   Complete by:  As directed    Driving restrictions   Complete by:  As directed    No driving  for two weeks   TED hose   Complete by:  As directed    Use stockings (TED hose) for three weeks on both leg(s).  You may remove them at night for sleeping.   Weight bearing as tolerated   Complete by:  As directed      Allergies as of 02/17/2018      Reactions   Celecoxib Tinitus   Lisinopril Shortness Of Breath, Swelling   Sulfa Antibiotics Swelling      Medication List    STOP taking these medications   diclofenac 75 MG EC tablet Commonly known as:  VOLTAREN   HYDROcodone-acetaminophen 5-325 MG tablet Commonly known as:  NORCO/VICODIN     TAKE these medications   aspirin 325 MG EC tablet Take 1 tablet (325 mg total) by mouth 2 (two) times daily for 19 days. Take one tablet (325 mg) Aspirin two times a day for three weeks following surgery. Then take one baby Aspirin (81 mg) once a day for three weeks. Then discontinue aspirin.   b complex vitamins tablet Take 1 tablet by mouth daily.   chlorthalidone 25 MG tablet Commonly known as:  HYGROTON Take 25 mg by mouth daily.   cholecalciferol 1000 units tablet Commonly known as:  VITAMIN D Take 7,000 Units by mouth daily.   gabapentin 300 MG capsule Commonly known as:  NEURONTIN Take 1 capsule (300 mg total) by mouth 3 (three) times daily. Gabapentin 300 mg Protocol Take a 300 mg capsule three times a day for two weeks following surgery. Then take a 300 mg capsule two times a day for two weeks.  Then resume home regimen of one 100 mg capsule three times a day. What changed:    medication strength  how much to take  additional instructions   HAWTHORN PO Take 5 tablets by mouth 2 (two) times daily.   methocarbamol 500 MG tablet Commonly known as:  ROBAXIN Take 1 tablet (500 mg total) by mouth every 6 (six) hours as needed for muscle spasms.   OVER THE COUNTER MEDICATION Take 2 each by mouth daily. Vitamin B12 with COQ 10 gummy   oxyCODONE 5 MG immediate release tablet Commonly known as:  Oxy  IR/ROXICODONE Take 1-2 tablets (5-10 mg total) by mouth every 6 (six) hours as needed for moderate pain or severe pain.   traMADol 50 MG tablet Commonly known as:  ULTRAM Take 1-2 tablets (50-100 mg total) by mouth every 6 (six) hours as needed for moderate pain (if oxycodone not effective). What changed:    how much to take  when to take this  reasons to take this            Discharge Care Instructions  (From admission, onward)        Start     Ordered   02/17/18 0000  Weight bearing as tolerated     02/17/18 0751   02/17/18 0000  Change dressing    Comments:  Change the dressing daily with sterile 4 x 4 inch gauze dressing and apply TED hose.  You may clean the incision with alcohol prior to redressing.   02/17/18 0751     Follow-up Information    Gaynelle Arabian, MD. Schedule an appointment as soon as possible for a visit on 03/02/2018.   Specialty:  Orthopedic Surgery Contact information: [address] [place] [address] [phone]           Signed: Theresa Duty, PA-C Orthopaedic Surgery 02/23/2018, 10:24 AM

## 2018-05-03 ENCOUNTER — Ambulatory Visit
Admission: RE | Admit: 2018-05-03 | Discharge: 2018-05-03 | Disposition: A | Discharge status: Home / Self Care | Payer: Medicare HMO | Source: Ambulatory Visit | Attending: Family Medicine | Admitting: Family Medicine

## 2018-05-03 ENCOUNTER — Other Ambulatory Visit: Payer: Self-pay | Admitting: Family Medicine

## 2018-05-03 DIAGNOSIS — M25552 Pain in left hip: Secondary | ICD-10-CM

## 2018-05-27 ENCOUNTER — Encounter: Payer: Medicare Other | Admitting: Internal Medicine

## 2019-03-04 ENCOUNTER — Other Ambulatory Visit: Payer: Self-pay | Admitting: Family Medicine

## 2019-03-04 DIAGNOSIS — Z1231 Encounter for screening mammogram for malignant neoplasm of breast: Secondary | ICD-10-CM

## 2019-05-03 ENCOUNTER — Ambulatory Visit: Payer: Medicare HMO

## 2019-06-29 ENCOUNTER — Inpatient Hospital Stay: Admission: RE | Admit: 2019-06-29 | Payer: Medicare Other | Source: Ambulatory Visit

## 2019-07-06 ENCOUNTER — Other Ambulatory Visit: Payer: Self-pay

## 2019-07-06 DIAGNOSIS — Z20822 Contact with and (suspected) exposure to covid-19: Secondary | ICD-10-CM

## 2019-07-08 LAB — NOVEL CORONAVIRUS, NAA: SARS-CoV-2, NAA: NOT DETECTED

## 2020-05-29 ENCOUNTER — Other Ambulatory Visit (HOSPITAL_COMMUNITY): Payer: Self-pay | Admitting: Orthopedic Surgery

## 2020-06-09 ENCOUNTER — Ambulatory Visit: Payer: Medicare Other | Attending: Internal Medicine

## 2020-06-09 DIAGNOSIS — Z23 Encounter for immunization: Secondary | ICD-10-CM

## 2020-06-09 NOTE — Progress Notes (Signed)
   Covid-19 Vaccination Clinic  Name:  Peggy Harrell    MRN: [id number] DOB: 04-Mar-1951  06/09/2020  Peggy Harrell was observed post Covid-19 immunization for 30 minutes based on pre-vaccination screening without incident. She was provided with Vaccine Information Sheet and instruction to access the V-Safe system.   Peggy Harrell was instructed to call 911 with any severe reactions post vaccine: Marland Kitchen Difficulty breathing  . Swelling of face and throat  . A fast heartbeat  . A bad rash all over body  . Dizziness and weakness

## 2020-07-26 ENCOUNTER — Encounter (HOSPITAL_BASED_OUTPATIENT_CLINIC_OR_DEPARTMENT_OTHER): Payer: Self-pay | Admitting: Orthopedic Surgery

## 2020-07-30 ENCOUNTER — Other Ambulatory Visit (HOSPITAL_COMMUNITY): Payer: Medicare Other

## 2020-08-02 ENCOUNTER — Ambulatory Visit (HOSPITAL_BASED_OUTPATIENT_CLINIC_OR_DEPARTMENT_OTHER): Admission: RE | Admit: 2020-08-02 | Payer: Medicare Other | Source: Home / Self Care | Admitting: Orthopedic Surgery

## 2020-08-02 SURGERY — BUNIONECTOMY
Anesthesia: General | Site: Toe | Laterality: Left

## 2020-11-22 ENCOUNTER — Other Ambulatory Visit: Payer: Self-pay | Admitting: Family Medicine

## 2020-11-22 DIAGNOSIS — R1011 Right upper quadrant pain: Secondary | ICD-10-CM

## 2020-12-12 ENCOUNTER — Other Ambulatory Visit: Payer: Self-pay | Admitting: Family Medicine

## 2020-12-12 ENCOUNTER — Ambulatory Visit
Admission: RE | Admit: 2020-12-12 | Discharge: 2020-12-12 | Disposition: A | Discharge status: Home / Self Care | Payer: Medicare Other | Source: Ambulatory Visit | Attending: Family Medicine | Admitting: Family Medicine

## 2020-12-12 DIAGNOSIS — R1011 Right upper quadrant pain: Secondary | ICD-10-CM

## 2020-12-12 DIAGNOSIS — M48061 Spinal stenosis, lumbar region without neurogenic claudication: Secondary | ICD-10-CM

## 2021-02-11 ENCOUNTER — Ambulatory Visit: Payer: Self-pay | Admitting: Surgery

## 2021-02-11 NOTE — H&P (Signed)
History of Present Illness Peggy Harrell. Peggy Helms MD; 02/11/2021 1:23 PM) The patient is a 70 year old female who presents for evaluation of gall stones. Referred by Dr. Nadyne Coombes for symptomatic gallstones  This is a 70 year old female in relatively good health who presents with a 3 year history of intermittent right-sided abdominal pain.  This tends to occur after eating, especially greasy foods.  Recently the symptoms have become more frequent and more severe.  She does have some associated nausea and vomiting.  She was traveling to [place] and had 1 episode where she had to go to the emergency department to rule out of myocardial infarction.  Recent ultrasound showed cholelithiasis without cholecystitis.  She also has some hepatic steatosis.  Liver function tests 3 months ago were normal.   Problem List/Past Medical Molli Hazard K. Kerianna Rawlinson, MD; 02/11/2021 1:23 PM) CHRONIC CHOLECYSTITIS WITH CALCULUS (K80.10)   Past Surgical History Marliss Coots, CNA; 02/11/2021 11:11 AM) Knee Surgery  Right.  Diagnostic Studies History Marliss Coots, CNA; 02/11/2021 11:11 AM) Colonoscopy  1-5 years ago  Allergies Marliss Coots, CNA; 02/11/2021 11:12 AM) Sulfa 10 *OPHTHALMIC AGENTS*  Allergies Reconciled   Medication History Marliss Coots, CNA; 02/11/2021 11:12 AM) HYDROcodone-Acetaminophen  (5-325MG  Tablet, Oral) Active. amLODIPine Besylate  (2.5MG  Tablet, Oral) Active. amLODIPine Besylate  (5MG  Tablet, Oral) Active. Baclofen  (10MG  Tablet, Oral) Active. Ciprofloxacin HCl  (500MG  Tablet, Oral) Active. Cefuroxime Axetil  (500MG  Tablet, Oral) Active. Gabapentin  (300MG  Capsule, Oral) Active. Omeprazole  (20MG  Capsule DR, Oral) Active. 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 Caffeine use  Coffee. No drug use  Tobacco use  Former smoker.  Family History , CNA; 02/11/2021 11:11 AM) Arthritis   Mother. Heart Disease  Father, Mother. Hypertension  Father, Mother.  Pregnancy / Birth History , CNA; 02/11/2021 11:11 AM) Age of menopause  65-55 Gravida  4 Irregular periods  Maternal age  66-20  Other Problems Marliss Coots. Jing Howatt, MD; 02/11/2021 1:23 PM) Arthritis  Cholelithiasis  Gastroesophageal Reflux Disease  Heart murmur      Review of Systems (Donyelle Alston CNA; 02/11/2021 11:11 AM) General Present- Weight Gain. Not Present- Appetite Loss, Chills, Fatigue, Fever, Night Sweats and Weight Loss. Skin Present- Dryness. Not Present- Change in Wart/Mole, Hives, Jaundice, New Lesions, Non-Healing Wounds, Rash and Ulcer. HEENT Present- Ringing in the Ears and Wears glasses/contact lenses. Not Present- Earache, Hearing Loss, Hoarseness, Nose Bleed, Oral Ulcers, Seasonal Allergies, Sinus Pain, Sore Throat, Visual Disturbances and Yellow Eyes. Respiratory Not Present- Bloody sputum, Chronic Cough, Difficulty Breathing, Snoring and Wheezing. Breast Not Present- Breast Mass, Breast Pain, Nipple Discharge and Skin Changes. Cardiovascular Present- Swelling of Extremities. Not Present- Chest Pain, Difficulty Breathing Lying Down, Leg Cramps, Palpitations, Rapid Heart Rate and Shortness of Breath. Female Genitourinary Not Present- Frequency, Nocturia, Painful Urination, Pelvic Pain and Urgency. Musculoskeletal Present- Back Pain. Not Present- Joint Pain, Joint Stiffness, Muscle Pain, Muscle Weakness and Swelling of Extremities. Neurological Present- Numbness. Not Present- Decreased Memory, Fainting, Headaches, Seizures, Tingling, Tremor, Trouble walking and Weakness. Psychiatric Not Present- Anxiety, Bipolar, Change in Sleep Pattern, Depression, Fearful and Frequent crying. Endocrine Present- Hair Changes. Not Present- Cold Intolerance, Excessive Hunger, Heat Intolerance, Hot flashes and New Diabetes. Hematology Not Present- Blood Thinners, Easy Bruising, Excessive bleeding,  Gland problems, HIV and Persistent Infections.  Vitals 02/13/2021 Alston CNA; 02/11/2021 11:12 AM) 02/11/2021 11:12 AM Weight: 244.5 lb   Height: 72 in  Body Surface Area: 2.32  m   Body Mass Index: 33.16 kg/m   Temp.: 98.3 F    Pulse: 103 (Regular)    P.OX: 99% (Room air) BP: 130/86(Sitting, Left Arm, Standard)        Physical Exam Molli Hazard K. Million Maharaj MD; 02/11/2021 1:24 PM)  The physical exam findings are as follows: Note: Constitutional: WDWN in NAD, conversant, no obvious deformities; resting comfortably Eyes: Pupils equal, round; sclera anicteric; moist conjunctiva; no lid lag HENT: Oral mucosa moist; good dentition Neck: No masses palpated, trachea midline; no thyromegaly Lungs: CTA bilaterally; normal respiratory effort CV: Regular rate and rhythm; no murmurs; extremities well-perfused with no edema Abd: +bowel sounds, soft, mildly tender in RUQ, no palpable organomegaly; no palpable hernias Musc: Normal gait; no apparent clubbing or cyanosis in extremities Lymphatic: No palpable cervical or axillary lymphadenopathy Skin: Warm, dry; no sign of jaundice Psychiatric - alert and oriented x 4; calm mood and affect    Assessment & Plan Molli Hazard K. Khamil Lamica MD; 02/11/2021 11:39 AM)  CHRONIC CHOLECYSTITIS WITH CALCULUS (K80.10)  Current Plans Schedule for Surgery - Laparoscopic cholecystectomy with intraoperative cholangiogram.  The surgical procedure has been discussed with the patient.  Potential risks, benefits, alternative treatments, and expected outcomes have been explained.  All of the patient's questions at this time have been answered.  The likelihood of reaching the patient's treatment goal is good.  The patient understand the proposed surgical procedure and wishes to proceed.  Peggy Harrell. Corliss Skains, MD, [facility] Surgery  General/ Trauma Surgery   02/11/2021 1:24 PM

## 2021-08-20 DIAGNOSIS — H25811 Combined forms of age-related cataract, right eye: Secondary | ICD-10-CM | POA: Diagnosis not present

## 2021-09-09 DIAGNOSIS — E785 Hyperlipidemia, unspecified: Secondary | ICD-10-CM | POA: Diagnosis not present

## 2021-09-09 DIAGNOSIS — K5900 Constipation, unspecified: Secondary | ICD-10-CM | POA: Diagnosis not present

## 2021-09-09 DIAGNOSIS — M5416 Radiculopathy, lumbar region: Secondary | ICD-10-CM | POA: Diagnosis not present

## 2021-09-09 DIAGNOSIS — I10 Essential (primary) hypertension: Secondary | ICD-10-CM | POA: Diagnosis not present

## 2021-09-11 DIAGNOSIS — Z0001 Encounter for general adult medical examination with abnormal findings: Secondary | ICD-10-CM | POA: Diagnosis not present

## 2021-09-11 DIAGNOSIS — E559 Vitamin D deficiency, unspecified: Secondary | ICD-10-CM | POA: Diagnosis not present

## 2021-09-11 DIAGNOSIS — R7301 Impaired fasting glucose: Secondary | ICD-10-CM | POA: Diagnosis not present

## 2021-09-11 DIAGNOSIS — E785 Hyperlipidemia, unspecified: Secondary | ICD-10-CM | POA: Diagnosis not present

## 2021-09-11 DIAGNOSIS — E039 Hypothyroidism, unspecified: Secondary | ICD-10-CM | POA: Diagnosis not present

## 2021-09-17 DIAGNOSIS — N3946 Mixed incontinence: Secondary | ICD-10-CM | POA: Diagnosis not present

## 2021-09-17 DIAGNOSIS — N390 Urinary tract infection, site not specified: Secondary | ICD-10-CM | POA: Diagnosis not present

## 2021-09-17 DIAGNOSIS — E785 Hyperlipidemia, unspecified: Secondary | ICD-10-CM | POA: Diagnosis not present

## 2021-09-17 DIAGNOSIS — I10 Essential (primary) hypertension: Secondary | ICD-10-CM | POA: Diagnosis not present

## 2021-09-19 DIAGNOSIS — M5416 Radiculopathy, lumbar region: Secondary | ICD-10-CM | POA: Diagnosis not present

## 2021-09-19 DIAGNOSIS — E785 Hyperlipidemia, unspecified: Secondary | ICD-10-CM | POA: Diagnosis not present

## 2021-09-19 DIAGNOSIS — I10 Essential (primary) hypertension: Secondary | ICD-10-CM | POA: Diagnosis not present

## 2021-09-19 DIAGNOSIS — K5900 Constipation, unspecified: Secondary | ICD-10-CM | POA: Diagnosis not present

## 2021-09-19 DIAGNOSIS — Z0001 Encounter for general adult medical examination with abnormal findings: Secondary | ICD-10-CM | POA: Diagnosis not present

## 2021-09-19 DIAGNOSIS — K219 Gastro-esophageal reflux disease without esophagitis: Secondary | ICD-10-CM | POA: Diagnosis not present

## 2021-09-19 DIAGNOSIS — N3946 Mixed incontinence: Secondary | ICD-10-CM | POA: Diagnosis not present

## 2021-09-19 DIAGNOSIS — Z6836 Body mass index (BMI) 36.0-36.9, adult: Secondary | ICD-10-CM | POA: Diagnosis not present

## 2021-09-24 DIAGNOSIS — J019 Acute sinusitis, unspecified: Secondary | ICD-10-CM | POA: Diagnosis not present

## 2021-09-24 DIAGNOSIS — R051 Acute cough: Secondary | ICD-10-CM | POA: Diagnosis not present

## 2021-10-30 DIAGNOSIS — H2512 Age-related nuclear cataract, left eye: Secondary | ICD-10-CM | POA: Diagnosis not present

## 2021-11-26 DIAGNOSIS — H25812 Combined forms of age-related cataract, left eye: Secondary | ICD-10-CM | POA: Diagnosis not present

## 2022-01-29 DIAGNOSIS — M5416 Radiculopathy, lumbar region: Secondary | ICD-10-CM | POA: Diagnosis not present

## 2022-01-29 DIAGNOSIS — R69 Illness, unspecified: Secondary | ICD-10-CM | POA: Diagnosis not present

## 2022-01-29 DIAGNOSIS — K5900 Constipation, unspecified: Secondary | ICD-10-CM | POA: Diagnosis not present

## 2022-01-29 DIAGNOSIS — E785 Hyperlipidemia, unspecified: Secondary | ICD-10-CM | POA: Diagnosis not present

## 2022-01-29 DIAGNOSIS — I10 Essential (primary) hypertension: Secondary | ICD-10-CM | POA: Diagnosis not present

## 2022-01-29 DIAGNOSIS — G2581 Restless legs syndrome: Secondary | ICD-10-CM | POA: Diagnosis not present

## 2022-01-29 DIAGNOSIS — N3946 Mixed incontinence: Secondary | ICD-10-CM | POA: Diagnosis not present

## 2022-01-29 DIAGNOSIS — Z0001 Encounter for general adult medical examination with abnormal findings: Secondary | ICD-10-CM | POA: Diagnosis not present

## 2022-01-29 DIAGNOSIS — Z23 Encounter for immunization: Secondary | ICD-10-CM | POA: Diagnosis not present

## 2022-02-26 DIAGNOSIS — D122 Benign neoplasm of ascending colon: Secondary | ICD-10-CM | POA: Diagnosis not present

## 2022-02-26 DIAGNOSIS — Z86010 Personal history of colonic polyps: Secondary | ICD-10-CM | POA: Diagnosis not present

## 2022-02-26 DIAGNOSIS — Z09 Encounter for follow-up examination after completed treatment for conditions other than malignant neoplasm: Secondary | ICD-10-CM | POA: Diagnosis not present

## 2022-02-28 DIAGNOSIS — E785 Hyperlipidemia, unspecified: Secondary | ICD-10-CM | POA: Diagnosis not present

## 2022-04-15 DIAGNOSIS — I10 Essential (primary) hypertension: Secondary | ICD-10-CM | POA: Diagnosis not present

## 2022-04-15 DIAGNOSIS — Z6833 Body mass index (BMI) 33.0-33.9, adult: Secondary | ICD-10-CM | POA: Diagnosis not present

## 2022-04-15 DIAGNOSIS — K635 Polyp of colon: Secondary | ICD-10-CM | POA: Diagnosis not present

## 2022-04-15 DIAGNOSIS — R600 Localized edema: Secondary | ICD-10-CM | POA: Diagnosis not present

## 2022-05-27 ENCOUNTER — Other Ambulatory Visit: Payer: Self-pay | Admitting: Family Medicine

## 2022-05-27 DIAGNOSIS — N3946 Mixed incontinence: Secondary | ICD-10-CM | POA: Diagnosis not present

## 2022-05-27 DIAGNOSIS — I10 Essential (primary) hypertension: Secondary | ICD-10-CM | POA: Diagnosis not present

## 2022-05-27 DIAGNOSIS — E2839 Other primary ovarian failure: Secondary | ICD-10-CM

## 2022-05-27 DIAGNOSIS — Z23 Encounter for immunization: Secondary | ICD-10-CM | POA: Diagnosis not present

## 2022-05-27 DIAGNOSIS — E785 Hyperlipidemia, unspecified: Secondary | ICD-10-CM | POA: Diagnosis not present

## 2022-05-27 DIAGNOSIS — Z1231 Encounter for screening mammogram for malignant neoplasm of breast: Secondary | ICD-10-CM

## 2022-05-27 DIAGNOSIS — R69 Illness, unspecified: Secondary | ICD-10-CM | POA: Diagnosis not present

## 2022-06-24 DIAGNOSIS — J309 Allergic rhinitis, unspecified: Secondary | ICD-10-CM | POA: Diagnosis not present

## 2022-06-24 DIAGNOSIS — R42 Dizziness and giddiness: Secondary | ICD-10-CM | POA: Diagnosis not present

## 2022-06-24 DIAGNOSIS — H6993 Unspecified Eustachian tube disorder, bilateral: Secondary | ICD-10-CM | POA: Diagnosis not present

## 2022-06-24 DIAGNOSIS — I10 Essential (primary) hypertension: Secondary | ICD-10-CM | POA: Diagnosis not present

## 2022-07-29 DIAGNOSIS — Z23 Encounter for immunization: Secondary | ICD-10-CM | POA: Diagnosis not present

## 2022-07-31 ENCOUNTER — Ambulatory Visit
Admission: RE | Admit: 2022-07-31 | Discharge: 2022-07-31 | Disposition: A | Discharge status: Home / Self Care | Payer: Medicare Other | Source: Ambulatory Visit | Attending: Family Medicine | Admitting: Family Medicine

## 2022-07-31 DIAGNOSIS — Z1231 Encounter for screening mammogram for malignant neoplasm of breast: Secondary | ICD-10-CM

## 2022-08-01 ENCOUNTER — Ambulatory Visit
Admission: RE | Admit: 2022-08-01 | Discharge: 2022-08-01 | Disposition: A | Discharge status: Home / Self Care | Payer: Medicare Other | Source: Ambulatory Visit | Attending: Family Medicine | Admitting: Family Medicine

## 2022-08-01 DIAGNOSIS — E2839 Other primary ovarian failure: Secondary | ICD-10-CM

## 2022-08-19 ENCOUNTER — Other Ambulatory Visit: Payer: Self-pay

## 2022-08-19 ENCOUNTER — Emergency Department (HOSPITAL_BASED_OUTPATIENT_CLINIC_OR_DEPARTMENT_OTHER)
Admission: EM | Admit: 2022-08-19 | Discharge: 2022-08-19 | Disposition: A | Discharge status: Home / Self Care | Payer: 59 | Attending: Emergency Medicine | Admitting: Emergency Medicine

## 2022-08-19 ENCOUNTER — Encounter (HOSPITAL_BASED_OUTPATIENT_CLINIC_OR_DEPARTMENT_OTHER): Payer: Self-pay

## 2022-08-19 DIAGNOSIS — H6501 Acute serous otitis media, right ear: Secondary | ICD-10-CM | POA: Insufficient documentation

## 2022-08-19 DIAGNOSIS — Z96651 Presence of right artificial knee joint: Secondary | ICD-10-CM | POA: Diagnosis not present

## 2022-08-19 DIAGNOSIS — I10 Essential (primary) hypertension: Secondary | ICD-10-CM | POA: Diagnosis not present

## 2022-08-19 DIAGNOSIS — H9311 Tinnitus, right ear: Secondary | ICD-10-CM | POA: Diagnosis present

## 2022-08-19 DIAGNOSIS — Z87891 Personal history of nicotine dependence: Secondary | ICD-10-CM | POA: Insufficient documentation

## 2022-08-19 MED ORDER — AMOXICILLIN 500 MG PO CAPS
1000.0000 mg | ORAL_CAPSULE | Freq: Once | ORAL | Status: AC
  Administered 2022-08-19: 1000 mg via ORAL
  Filled 2022-08-19: qty 2

## 2022-08-19 MED ORDER — AMOXICILLIN 500 MG PO CAPS: 1000.0000 mg | ORAL_CAPSULE | Freq: Two times a day (BID) | ORAL | 0 refills | Status: AC

## 2022-08-19 NOTE — ED Triage Notes (Signed)
Right ear pain with feeling that ear is stopped up and does have ringing in her ears. Has been cortosporin drops.

## 2022-08-19 NOTE — ED Provider Notes (Signed)
[facility] Provider Note: Lowella Dell, MD, FACEP  CSN: [id number] MRN: [id number] ARRIVAL: 08/19/22 at 0329 ROOM: DB010/DB010   CHIEF COMPLAINT  Ear Fullness   HISTORY OF PRESENT ILLNESS  08/19/22 3:48 AM Peggy Harrell is a 71 y.o. female who recently had cerumen cleaned from her ears by her PCP.  This was followed by treatment with Cortisporin Ionic.  She was doing well until about 2 days ago when she developed increased ringing in her right ear along with a sense of decreased hearing and feeling that it is stopped up.  That ear does not actually hurt.  There has been no drainage from the ear.  She does not have a fever with this.   Past Medical History:  Diagnosis Date   Anemia    hx of   Arthritis    Complication of anesthesia    Hard to wake up   Heart murmur    Hypertension    Pneumonia    as a child   PONV (postoperative nausea and vomiting)     Past Surgical History:  Procedure Laterality Date   JOINT REPLACEMENT      Scheduled Right total knee Dr. Lequita Halt 02-15-18   PARTIAL HYSTERECTOMY     TONSILLECTOMY     TOTAL KNEE ARTHROPLASTY Right 02/15/2018   Procedure: RIGHT TOTAL KNEE ARTHROPLASTY;  Surgeon: Ollen Gross, MD;  Location: [facility];  Service: Orthopedics;  Laterality: Right;    Family History  Problem Relation Age of Onset   Stroke Mother    Heart attack Mother    Heart attack Father     Social History   Tobacco Use   Smoking status: Former   Smokeless tobacco: Never   Tobacco comments:    quit 25 years ago  Vaping Use   Vaping Use: Never used  Substance Use Topics   Alcohol use: No   Drug use: No    Prior to Admission medications   Medication Sig Start Date End Date Taking? Authorizing Provider  amoxicillin (AMOXIL) 500 MG capsule Take 2 capsules (1,000 mg total) by mouth 2 (two) times daily for 7 days. 08/19/22 08/26/22 Yes Cruzita Lipa, MD  b complex vitamins tablet Take 1 tablet by mouth daily.    [provider]   chlorthalidone (HYGROTON) 25 MG tablet Take 25 mg by mouth daily.  04/17/17   [provider]  cholecalciferol (VITAMIN D) 1000 units tablet Take 7,000 Units by mouth daily.     [provider]  OVER THE COUNTER MEDICATION Take 2 each by mouth daily. Vitamin B12 with COQ 10 gummy    [provider]    Allergies Celecoxib, Lisinopril, and Sulfa antibiotics   REVIEW OF SYSTEMS  Negative except as noted here or in the History of Present Illness.   PHYSICAL EXAMINATION  Initial Vital Signs Blood pressure (!) 178/101, pulse 88, temperature 98.1 F (36.7 C), temperature source Oral, resp. rate 14, height 5\' 8"  (1.727 m), weight 108.9 kg, SpO2 98 %.  Examination General: Well-developed, well-nourished female in no acute distress; appearance consistent with age of record HENT: normocephalic; atraumatic; left TM normal, right TM mildly erythematous with fluid behind it Eyes: Normal appearance Neck: supple Heart: regular rate and rhythm Lungs: clear to auscultation bilaterally Abdomen: soft; nondistended; nontender; bowel sounds present Extremities: No deformity; full range of motion Neurologic: Awake, alert and oriented; motor function intact in all extremities and symmetric; no facial droop Skin: Warm and dry Psychiatric: Normal mood and  affect   RESULTS  Summary of this visit's results, reviewed and interpreted by myself:   EKG Interpretation  Date/Time:    Ventricular Rate:    PR Interval:    QRS Duration:   QT Interval:    QTC Calculation:   R Axis:     Text Interpretation:         Laboratory Studies: No results found for this or any previous visit (from the past 24 hour(s)). Imaging Studies: No results found.  ED COURSE and MDM  Nursing notes, initial and subsequent vitals signs, including pulse oximetry, reviewed and interpreted by myself.  Vitals:   08/19/22 0338 08/19/22 0340  BP: (!) 178/101   Pulse: 88   Resp: 14   Temp: 98.1  F (36.7 C)   TempSrc: Oral   SpO2: 98%   Weight:  108.9 kg  Height:  5\' 8"  (1.727 m)   Medications  amoxicillin (AMOXIL) capsule 1,000 mg (has no administration in time range)    The patient appears to have of mild serous otitis media on the right.  There is some mild erythema suggesting the fluid is becoming infected.  We will go ahead and treat with amoxicillin.  She was advised that if symptoms do not improve with antibiotics her PCP may want to refer her to an ENT specialist.  PROCEDURES  Procedures   ED DIAGNOSES     ICD-10-CM   1. Non-recurrent acute serous otitis media of right ear  H65.01          Timmy Bubeck, , MD 08/19/22 [phone]

## 2022-09-01 DIAGNOSIS — Z09 Encounter for follow-up examination after completed treatment for conditions other than malignant neoplasm: Secondary | ICD-10-CM | POA: Diagnosis not present

## 2022-09-01 DIAGNOSIS — Z86010 Personal history of colonic polyps: Secondary | ICD-10-CM | POA: Diagnosis not present

## 2022-09-01 DIAGNOSIS — D122 Benign neoplasm of ascending colon: Secondary | ICD-10-CM | POA: Diagnosis not present

## 2022-09-15 DIAGNOSIS — R69 Illness, unspecified: Secondary | ICD-10-CM | POA: Diagnosis not present

## 2022-09-15 DIAGNOSIS — H9311 Tinnitus, right ear: Secondary | ICD-10-CM | POA: Diagnosis not present

## 2022-09-15 DIAGNOSIS — I10 Essential (primary) hypertension: Secondary | ICD-10-CM | POA: Diagnosis not present

## 2022-09-15 DIAGNOSIS — Z0000 Encounter for general adult medical examination without abnormal findings: Secondary | ICD-10-CM | POA: Diagnosis not present

## 2022-09-15 DIAGNOSIS — R4189 Other symptoms and signs involving cognitive functions and awareness: Secondary | ICD-10-CM | POA: Diagnosis not present

## 2022-09-15 DIAGNOSIS — E785 Hyperlipidemia, unspecified: Secondary | ICD-10-CM | POA: Diagnosis not present

## 2022-09-15 DIAGNOSIS — M25512 Pain in left shoulder: Secondary | ICD-10-CM | POA: Diagnosis not present

## 2022-09-15 DIAGNOSIS — H9201 Otalgia, right ear: Secondary | ICD-10-CM | POA: Diagnosis not present

## 2022-09-15 DIAGNOSIS — G2581 Restless legs syndrome: Secondary | ICD-10-CM | POA: Diagnosis not present

## 2022-10-13 DIAGNOSIS — I10 Essential (primary) hypertension: Secondary | ICD-10-CM | POA: Diagnosis not present

## 2022-10-13 DIAGNOSIS — R4189 Other symptoms and signs involving cognitive functions and awareness: Secondary | ICD-10-CM | POA: Diagnosis not present

## 2022-10-13 DIAGNOSIS — F4320 Adjustment disorder, unspecified: Secondary | ICD-10-CM | POA: Diagnosis not present

## 2022-10-13 DIAGNOSIS — R69 Illness, unspecified: Secondary | ICD-10-CM | POA: Diagnosis not present

## 2022-10-13 DIAGNOSIS — F32A Depression, unspecified: Secondary | ICD-10-CM | POA: Diagnosis not present

## 2022-10-17 DIAGNOSIS — R252 Cramp and spasm: Secondary | ICD-10-CM | POA: Diagnosis not present

## 2022-10-17 DIAGNOSIS — K529 Noninfective gastroenteritis and colitis, unspecified: Secondary | ICD-10-CM | POA: Diagnosis not present

## 2022-10-17 DIAGNOSIS — I10 Essential (primary) hypertension: Secondary | ICD-10-CM | POA: Diagnosis not present

## 2022-10-17 DIAGNOSIS — R519 Headache, unspecified: Secondary | ICD-10-CM | POA: Diagnosis not present

## 2022-11-14 DIAGNOSIS — J9801 Acute bronchospasm: Secondary | ICD-10-CM | POA: Diagnosis not present

## 2022-11-14 DIAGNOSIS — J209 Acute bronchitis, unspecified: Secondary | ICD-10-CM | POA: Diagnosis not present

## 2022-11-14 DIAGNOSIS — R051 Acute cough: Secondary | ICD-10-CM | POA: Diagnosis not present

## 2022-11-14 DIAGNOSIS — I10 Essential (primary) hypertension: Secondary | ICD-10-CM | POA: Diagnosis not present

## 2023-02-25 DIAGNOSIS — Z961 Presence of intraocular lens: Secondary | ICD-10-CM | POA: Diagnosis not present

## 2023-02-25 DIAGNOSIS — H43813 Vitreous degeneration, bilateral: Secondary | ICD-10-CM | POA: Diagnosis not present

## 2023-03-13 DIAGNOSIS — D179 Benign lipomatous neoplasm, unspecified: Secondary | ICD-10-CM | POA: Diagnosis not present

## 2023-03-13 DIAGNOSIS — N390 Urinary tract infection, site not specified: Secondary | ICD-10-CM | POA: Diagnosis not present

## 2023-03-13 DIAGNOSIS — I10 Essential (primary) hypertension: Secondary | ICD-10-CM | POA: Diagnosis not present

## 2023-03-19 DIAGNOSIS — I10 Essential (primary) hypertension: Secondary | ICD-10-CM | POA: Diagnosis not present

## 2023-03-19 DIAGNOSIS — N390 Urinary tract infection, site not specified: Secondary | ICD-10-CM | POA: Diagnosis not present

## 2023-03-19 DIAGNOSIS — E785 Hyperlipidemia, unspecified: Secondary | ICD-10-CM | POA: Diagnosis not present

## 2023-03-19 DIAGNOSIS — K219 Gastro-esophageal reflux disease without esophagitis: Secondary | ICD-10-CM | POA: Diagnosis not present

## 2023-04-13 DIAGNOSIS — Z0001 Encounter for general adult medical examination with abnormal findings: Secondary | ICD-10-CM | POA: Diagnosis not present

## 2023-04-13 DIAGNOSIS — I10 Essential (primary) hypertension: Secondary | ICD-10-CM | POA: Diagnosis not present

## 2023-04-13 DIAGNOSIS — Z23 Encounter for immunization: Secondary | ICD-10-CM | POA: Diagnosis not present

## 2023-04-13 DIAGNOSIS — G8929 Other chronic pain: Secondary | ICD-10-CM | POA: Diagnosis not present

## 2023-04-13 DIAGNOSIS — Z6833 Body mass index (BMI) 33.0-33.9, adult: Secondary | ICD-10-CM | POA: Diagnosis not present

## 2023-04-13 DIAGNOSIS — K219 Gastro-esophageal reflux disease without esophagitis: Secondary | ICD-10-CM | POA: Diagnosis not present

## 2023-04-13 DIAGNOSIS — E785 Hyperlipidemia, unspecified: Secondary | ICD-10-CM | POA: Diagnosis not present

## 2023-04-13 IMAGING — US US ABDOMEN LIMITED RUQ/ASCITES
1 series · 14 of 25 positions shown · non-contrast
Comparison: None.

CLINICAL DATA: Right upper quadrant pain for 3 weeks

EXAM:
ULTRASOUND ABDOMEN LIMITED RIGHT UPPER QUADRANT

[Series 1: us abdomen limited ruq/ascites · 0.19mm/px · 14 of 50 slices shown]
[im 1/50]
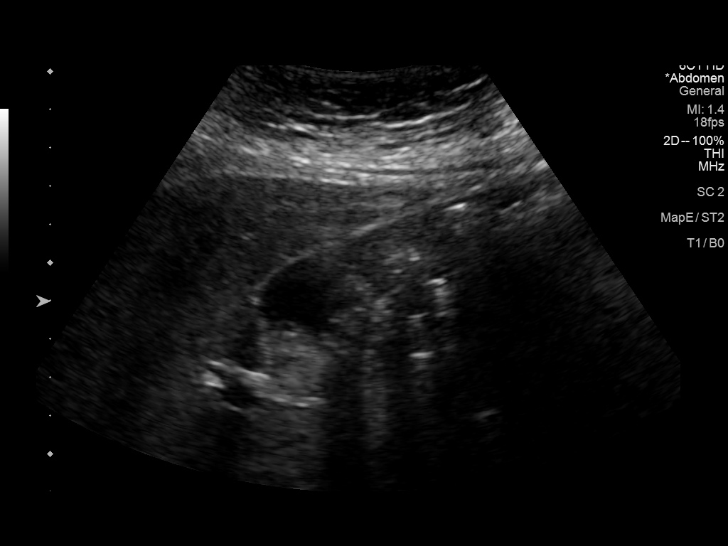
[im 5/50]
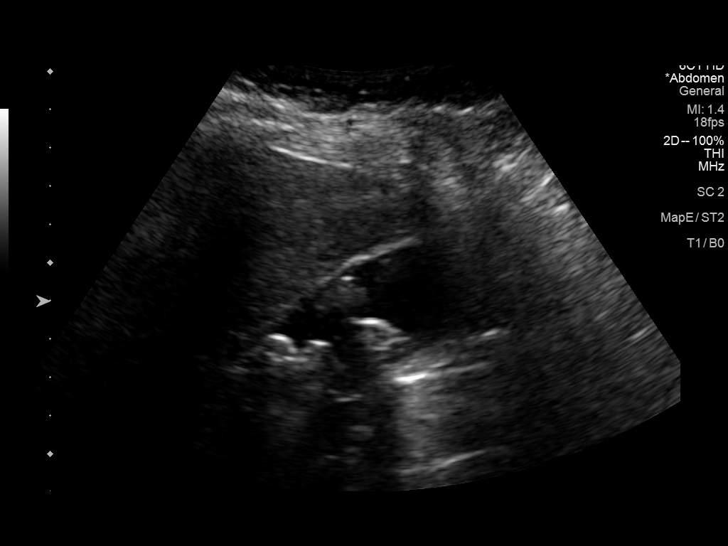
[im 9/50]
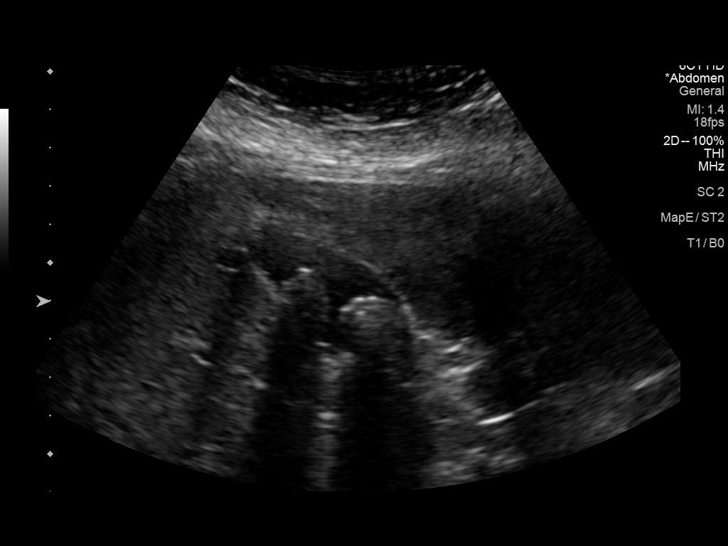
[im 13/50]
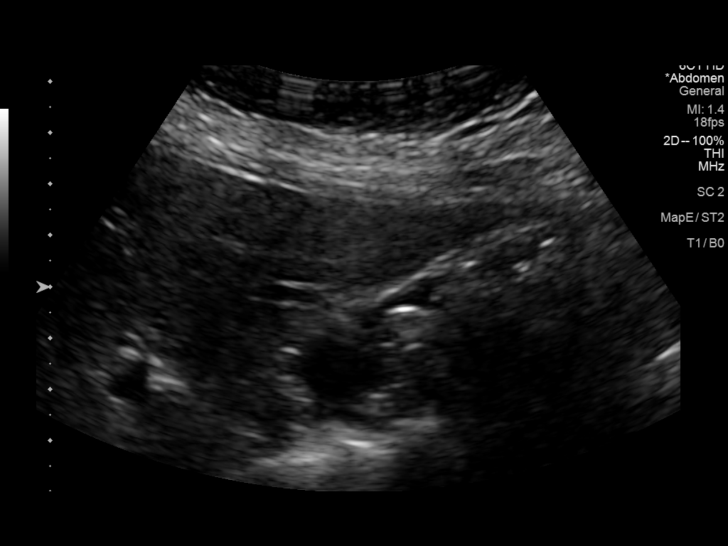
[im 17/50]
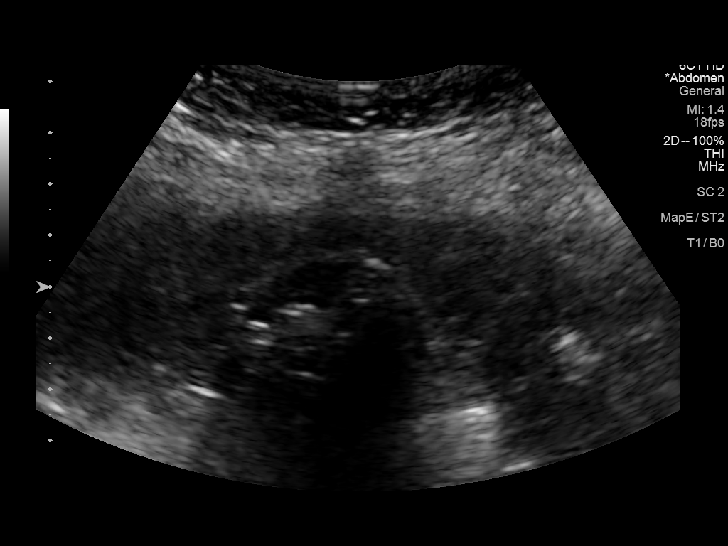
[im 19/50]
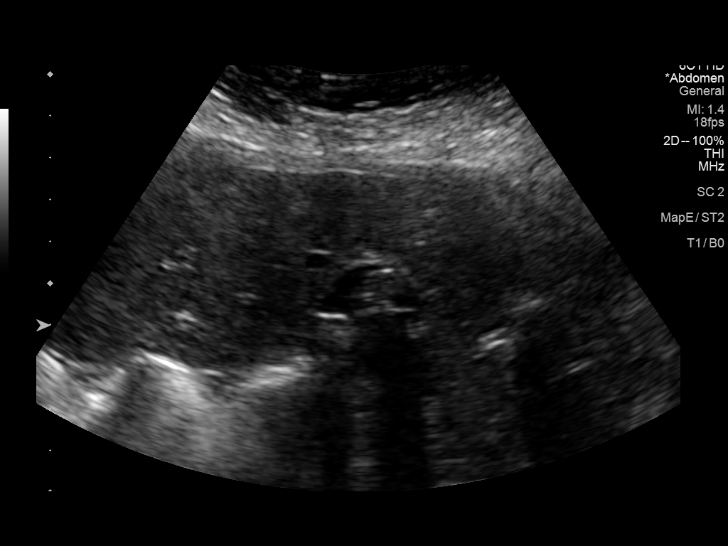
[im 23/50]
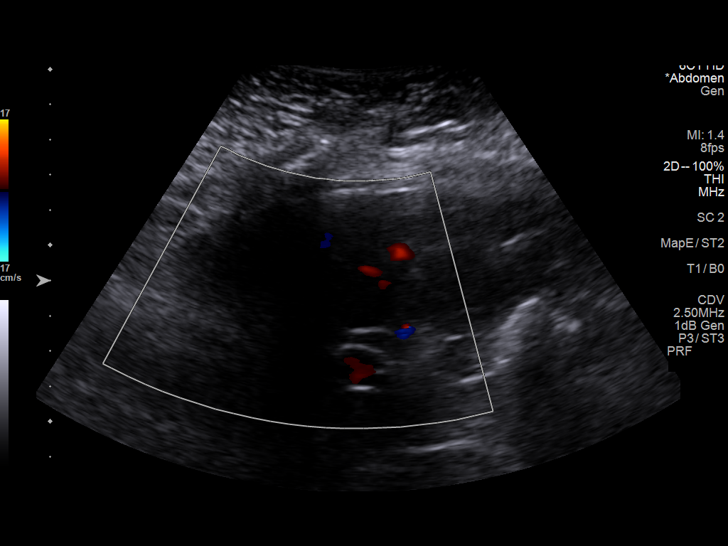
[im 27/50]
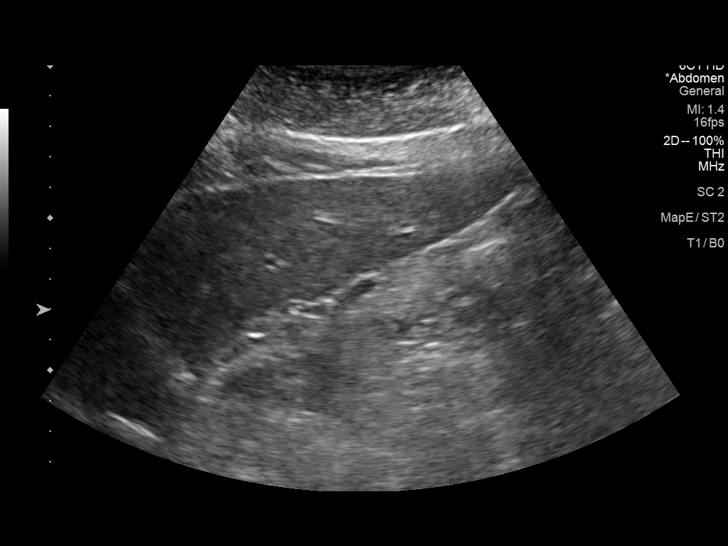
[im 31/50]
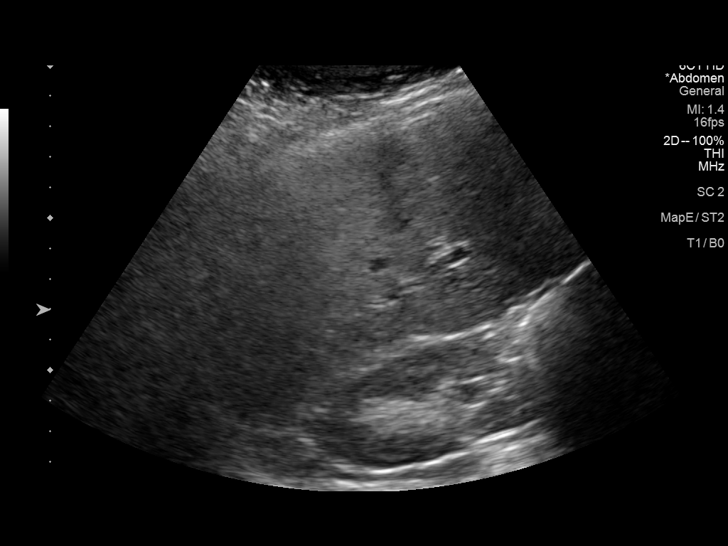
[im 33/50]
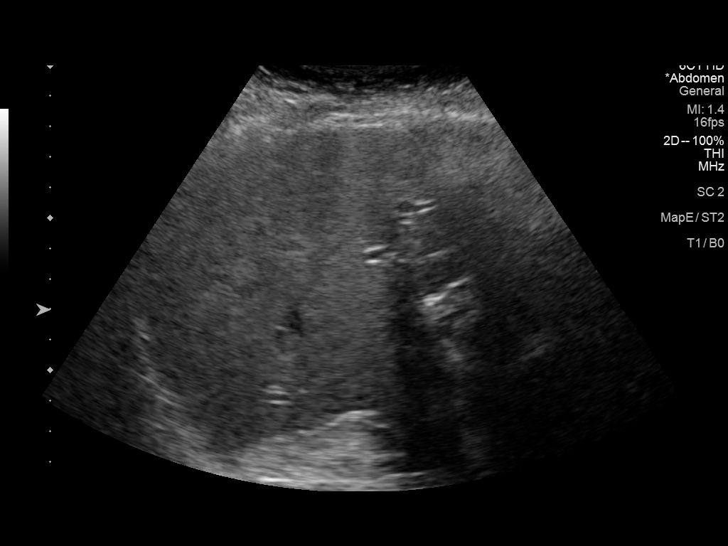
[im 37/50]
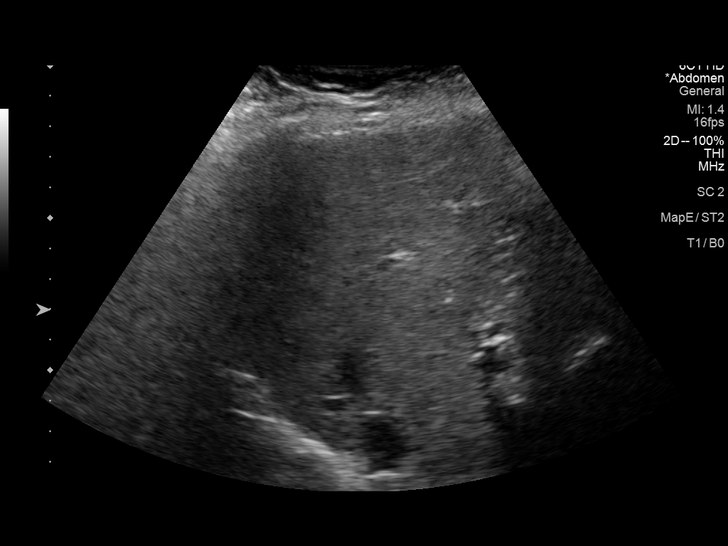
[im 41/50]
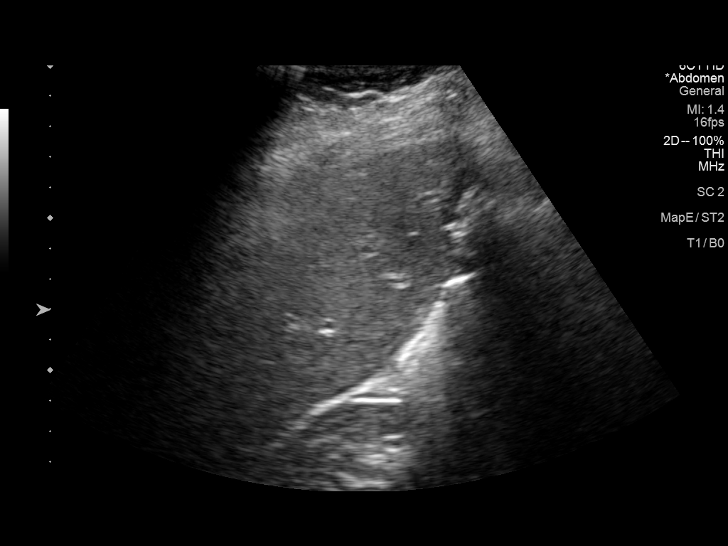
[im 45/50]
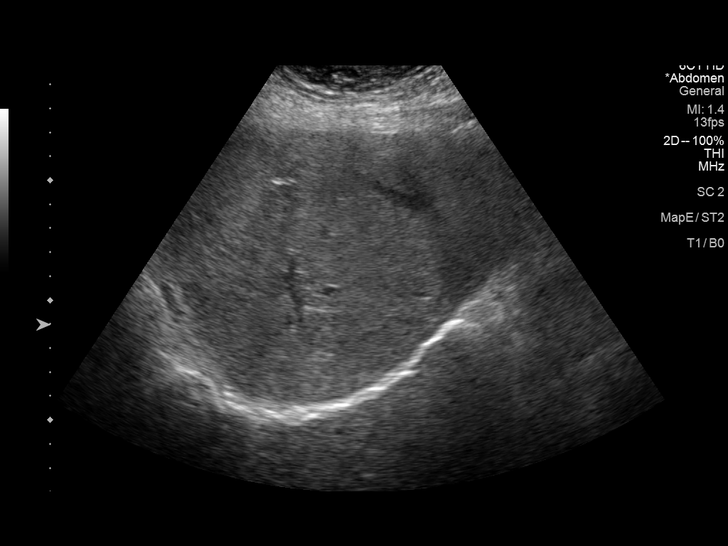
[im 50/50]
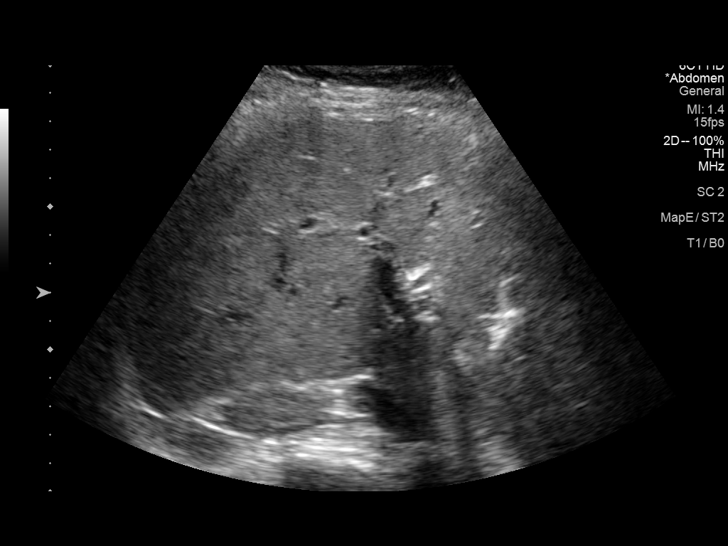

[14 of 25 positions shown; findings below may reference images not displayed]

FINDINGS: Gallbladder:

Multiple shadowing gallstones are identified measuring up to 16 mm
in size. No gallbladder wall thickening or pericholecystic fluid.
Negative Murphy sign.

Common bile duct:

Diameter: 5 mm

Liver:

Coarsened increased liver echotexture is noted, nonspecific but
likely representing hepatic steatosis. No focal liver abnormalities.
No intrahepatic duct dilation. Portal vein is patent on color
Doppler imaging with normal direction of blood flow towards the
liver.

Other: None.
IMPRESSION: 1. Cholelithiasis without cholecystitis.
2. Coarsened increased liver echotexture most consistent with
hepatic steatosis.

## 2023-07-20 DIAGNOSIS — I10 Essential (primary) hypertension: Secondary | ICD-10-CM | POA: Diagnosis not present

## 2023-07-20 DIAGNOSIS — F332 Major depressive disorder, recurrent severe without psychotic features: Secondary | ICD-10-CM | POA: Diagnosis not present

## 2023-07-20 DIAGNOSIS — E785 Hyperlipidemia, unspecified: Secondary | ICD-10-CM | POA: Diagnosis not present

## 2023-10-19 DIAGNOSIS — H6123 Impacted cerumen, bilateral: Secondary | ICD-10-CM | POA: Diagnosis not present

## 2023-11-30 DIAGNOSIS — M9903 Segmental and somatic dysfunction of lumbar region: Secondary | ICD-10-CM | POA: Diagnosis not present

## 2023-11-30 DIAGNOSIS — I10 Essential (primary) hypertension: Secondary | ICD-10-CM | POA: Diagnosis not present

## 2023-11-30 DIAGNOSIS — M5386 Other specified dorsopathies, lumbar region: Secondary | ICD-10-CM | POA: Diagnosis not present

## 2023-11-30 DIAGNOSIS — E785 Hyperlipidemia, unspecified: Secondary | ICD-10-CM | POA: Diagnosis not present

## 2023-11-30 DIAGNOSIS — M9905 Segmental and somatic dysfunction of pelvic region: Secondary | ICD-10-CM | POA: Diagnosis not present

## 2023-11-30 DIAGNOSIS — K5900 Constipation, unspecified: Secondary | ICD-10-CM | POA: Diagnosis not present

## 2023-11-30 DIAGNOSIS — M5136 Other intervertebral disc degeneration, lumbar region with discogenic back pain only: Secondary | ICD-10-CM | POA: Diagnosis not present

## 2023-11-30 DIAGNOSIS — F332 Major depressive disorder, recurrent severe without psychotic features: Secondary | ICD-10-CM | POA: Diagnosis not present

## 2023-11-30 DIAGNOSIS — M5415 Radiculopathy, thoracolumbar region: Secondary | ICD-10-CM | POA: Diagnosis not present

## 2023-11-30 DIAGNOSIS — M9902 Segmental and somatic dysfunction of thoracic region: Secondary | ICD-10-CM | POA: Diagnosis not present

## 2023-12-07 DIAGNOSIS — M9903 Segmental and somatic dysfunction of lumbar region: Secondary | ICD-10-CM | POA: Diagnosis not present

## 2023-12-07 DIAGNOSIS — M5415 Radiculopathy, thoracolumbar region: Secondary | ICD-10-CM | POA: Diagnosis not present

## 2023-12-07 DIAGNOSIS — M5386 Other specified dorsopathies, lumbar region: Secondary | ICD-10-CM | POA: Diagnosis not present

## 2023-12-07 DIAGNOSIS — M9902 Segmental and somatic dysfunction of thoracic region: Secondary | ICD-10-CM | POA: Diagnosis not present

## 2023-12-07 DIAGNOSIS — M5136 Other intervertebral disc degeneration, lumbar region with discogenic back pain only: Secondary | ICD-10-CM | POA: Diagnosis not present

## 2023-12-07 DIAGNOSIS — M9905 Segmental and somatic dysfunction of pelvic region: Secondary | ICD-10-CM | POA: Diagnosis not present

## 2023-12-14 DIAGNOSIS — M9903 Segmental and somatic dysfunction of lumbar region: Secondary | ICD-10-CM | POA: Diagnosis not present

## 2023-12-14 DIAGNOSIS — M5386 Other specified dorsopathies, lumbar region: Secondary | ICD-10-CM | POA: Diagnosis not present

## 2023-12-14 DIAGNOSIS — M5415 Radiculopathy, thoracolumbar region: Secondary | ICD-10-CM | POA: Diagnosis not present

## 2023-12-14 DIAGNOSIS — M9902 Segmental and somatic dysfunction of thoracic region: Secondary | ICD-10-CM | POA: Diagnosis not present

## 2023-12-14 DIAGNOSIS — M9905 Segmental and somatic dysfunction of pelvic region: Secondary | ICD-10-CM | POA: Diagnosis not present

## 2023-12-14 DIAGNOSIS — M5136 Other intervertebral disc degeneration, lumbar region with discogenic back pain only: Secondary | ICD-10-CM | POA: Diagnosis not present

## 2024-01-01 DIAGNOSIS — H93291 Other abnormal auditory perceptions, right ear: Secondary | ICD-10-CM | POA: Diagnosis not present

## 2024-01-26 DIAGNOSIS — R600 Localized edema: Secondary | ICD-10-CM | POA: Diagnosis not present

## 2024-01-26 DIAGNOSIS — I10 Essential (primary) hypertension: Secondary | ICD-10-CM | POA: Diagnosis not present

## 2024-01-26 DIAGNOSIS — H6993 Unspecified Eustachian tube disorder, bilateral: Secondary | ICD-10-CM | POA: Diagnosis not present

## 2024-01-26 DIAGNOSIS — T887XXA Unspecified adverse effect of drug or medicament, initial encounter: Secondary | ICD-10-CM | POA: Diagnosis not present

## 2024-03-14 DIAGNOSIS — Z961 Presence of intraocular lens: Secondary | ICD-10-CM | POA: Diagnosis not present

## 2024-03-14 DIAGNOSIS — H43813 Vitreous degeneration, bilateral: Secondary | ICD-10-CM | POA: Diagnosis not present

## 2024-04-11 DIAGNOSIS — I10 Essential (primary) hypertension: Secondary | ICD-10-CM | POA: Diagnosis not present

## 2024-04-11 DIAGNOSIS — K625 Hemorrhage of anus and rectum: Secondary | ICD-10-CM | POA: Diagnosis not present

## 2024-04-11 DIAGNOSIS — K5900 Constipation, unspecified: Secondary | ICD-10-CM | POA: Diagnosis not present

## 2024-04-11 DIAGNOSIS — R400 Somnolence: Secondary | ICD-10-CM | POA: Diagnosis not present

## 2024-05-02 DIAGNOSIS — H903 Sensorineural hearing loss, bilateral: Secondary | ICD-10-CM | POA: Diagnosis not present

## 2024-05-23 ENCOUNTER — Emergency Department (HOSPITAL_BASED_OUTPATIENT_CLINIC_OR_DEPARTMENT_OTHER): Admission: EM | Admit: 2024-05-23 | Discharge: 2024-05-23 | Disposition: A | Discharge status: Home / Self Care

## 2024-05-23 ENCOUNTER — Other Ambulatory Visit: Payer: Self-pay

## 2024-05-23 ENCOUNTER — Encounter (HOSPITAL_BASED_OUTPATIENT_CLINIC_OR_DEPARTMENT_OTHER): Payer: Self-pay | Admitting: Urology

## 2024-05-23 ENCOUNTER — Emergency Department (HOSPITAL_BASED_OUTPATIENT_CLINIC_OR_DEPARTMENT_OTHER)

## 2024-05-23 DIAGNOSIS — R519 Headache, unspecified: Secondary | ICD-10-CM | POA: Diagnosis not present

## 2024-05-23 DIAGNOSIS — Y92410 Unspecified street and highway as the place of occurrence of the external cause: Secondary | ICD-10-CM | POA: Insufficient documentation

## 2024-05-23 DIAGNOSIS — I10 Essential (primary) hypertension: Secondary | ICD-10-CM | POA: Diagnosis not present

## 2024-05-23 DIAGNOSIS — M542 Cervicalgia: Secondary | ICD-10-CM | POA: Insufficient documentation

## 2024-05-23 DIAGNOSIS — M545 Low back pain, unspecified: Secondary | ICD-10-CM | POA: Insufficient documentation

## 2024-05-23 DIAGNOSIS — Z79899 Other long term (current) drug therapy: Secondary | ICD-10-CM | POA: Diagnosis not present

## 2024-05-23 MED ORDER — IBUPROFEN 400 MG PO TABS
600.0000 mg | ORAL_TABLET | Freq: Once | ORAL | Status: AC
  Administered 2024-05-23: 600 mg via ORAL
  Filled 2024-05-23: qty 1

## 2024-05-23 MED ORDER — ACETAMINOPHEN 500 MG PO TABS
1000.0000 mg | ORAL_TABLET | Freq: Once | ORAL | Status: AC
  Administered 2024-05-23: 1000 mg via ORAL
  Filled 2024-05-23: qty 2
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
## 2024-05-23 NOTE — ED Notes (Signed)
 Patient transported to CT
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
## 2024-05-23 NOTE — ED Notes (Signed)
 ED Provider at bedside.

## 2024-05-23 NOTE — Discharge Instructions (Signed)
 Evaluation today was reassuring.  CT scans were negative for acute injury.  Please follow-up with your PCP.  In the meantime he can apply ice in areas that hurt 3-4 times a day and continue taking Tylenol  as needed.  If your symptoms change or worsen anyway please return to the ED for further evaluation.

## 2024-05-30 DIAGNOSIS — I10 Essential (primary) hypertension: Secondary | ICD-10-CM | POA: Diagnosis not present

## 2024-05-30 DIAGNOSIS — Z23 Encounter for immunization: Secondary | ICD-10-CM | POA: Diagnosis not present

## 2024-06-21 ENCOUNTER — Ambulatory Visit

## 2024-06-26 DIAGNOSIS — J029 Acute pharyngitis, unspecified: Secondary | ICD-10-CM | POA: Diagnosis not present

## 2024-06-26 DIAGNOSIS — R059 Cough, unspecified: Secondary | ICD-10-CM | POA: Diagnosis not present

## 2024-06-26 DIAGNOSIS — R0981 Nasal congestion: Secondary | ICD-10-CM | POA: Diagnosis not present

## 2024-07-25 ENCOUNTER — Encounter: Payer: Self-pay | Admitting: Family Medicine
